# Patient Record
Sex: Female | Born: 1955 | Race: White | Hispanic: No | Marital: Married | Smoking: Former smoker
Health system: Southern US, Community
[De-identification: ages and names within clinical notes are randomized; demographics above are authoritative.]

## PROBLEM LIST (undated history)

## (undated) DIAGNOSIS — K219 Gastro-esophageal reflux disease without esophagitis: Secondary | ICD-10-CM

## (undated) DIAGNOSIS — T8859XA Other complications of anesthesia, initial encounter: Secondary | ICD-10-CM

## (undated) DIAGNOSIS — F419 Anxiety disorder, unspecified: Secondary | ICD-10-CM

## (undated) DIAGNOSIS — M1990 Unspecified osteoarthritis, unspecified site: Secondary | ICD-10-CM

## (undated) DIAGNOSIS — T4145XA Adverse effect of unspecified anesthetic, initial encounter: Secondary | ICD-10-CM

## (undated) DIAGNOSIS — N644 Mastodynia: Secondary | ICD-10-CM

## (undated) HISTORY — PX: BREAST SURGERY: SHX581

## (undated) HISTORY — PX: HERNIA REPAIR: SHX51

## (undated) HISTORY — PX: AUGMENTATION MAMMAPLASTY: SUR837

## (undated) HISTORY — PX: FOOT SURGERY: SHX648

---

## 1983-02-10 HISTORY — PX: TUBAL LIGATION: SHX77

## 1996-02-10 HISTORY — PX: PLACEMENT OF BREAST IMPLANTS: SHX6334

## 1997-11-09 ENCOUNTER — Other Ambulatory Visit: Admission: RE | Admit: 1997-11-09 | Discharge: 1997-11-09 | Payer: Self-pay | Admitting: *Deleted

## 1998-03-04 ENCOUNTER — Ambulatory Visit (HOSPITAL_COMMUNITY): Admission: RE | Admit: 1998-03-04 | Discharge: 1998-03-04 | Payer: Self-pay | Admitting: Obstetrics and Gynecology

## 1998-12-04 ENCOUNTER — Other Ambulatory Visit: Admission: RE | Admit: 1998-12-04 | Discharge: 1998-12-04 | Payer: Self-pay | Admitting: Obstetrics and Gynecology

## 1999-10-03 ENCOUNTER — Encounter: Payer: Self-pay | Admitting: Orthopedic Surgery

## 1999-10-03 ENCOUNTER — Encounter: Admission: RE | Admit: 1999-10-03 | Discharge: 1999-10-03 | Payer: Self-pay | Admitting: Orthopedic Surgery

## 1999-10-30 ENCOUNTER — Encounter: Payer: Self-pay | Admitting: Orthopedic Surgery

## 1999-10-30 ENCOUNTER — Ambulatory Visit (HOSPITAL_COMMUNITY): Admission: RE | Admit: 1999-10-30 | Discharge: 1999-10-30 | Payer: Self-pay | Admitting: Orthopedic Surgery

## 1999-11-13 ENCOUNTER — Encounter: Payer: Self-pay | Admitting: Orthopedic Surgery

## 1999-11-13 ENCOUNTER — Ambulatory Visit (HOSPITAL_COMMUNITY): Admission: RE | Admit: 1999-11-13 | Discharge: 1999-11-13 | Payer: Self-pay | Admitting: Orthopedic Surgery

## 1999-12-12 ENCOUNTER — Other Ambulatory Visit: Admission: RE | Admit: 1999-12-12 | Discharge: 1999-12-12 | Payer: Self-pay | Admitting: Obstetrics and Gynecology

## 2000-06-02 ENCOUNTER — Encounter: Payer: Self-pay | Admitting: General Surgery

## 2000-06-02 ENCOUNTER — Encounter: Admission: RE | Admit: 2000-06-02 | Discharge: 2000-06-02 | Payer: Self-pay | Admitting: General Surgery

## 2000-06-03 ENCOUNTER — Ambulatory Visit (HOSPITAL_BASED_OUTPATIENT_CLINIC_OR_DEPARTMENT_OTHER): Admission: RE | Admit: 2000-06-03 | Discharge: 2000-06-03 | Payer: Self-pay | Admitting: General Surgery

## 2000-06-03 ENCOUNTER — Encounter (INDEPENDENT_AMBULATORY_CARE_PROVIDER_SITE_OTHER): Payer: Self-pay | Admitting: *Deleted

## 2000-12-17 ENCOUNTER — Other Ambulatory Visit: Admission: RE | Admit: 2000-12-17 | Discharge: 2000-12-17 | Payer: Self-pay | Admitting: Obstetrics and Gynecology

## 2002-01-11 ENCOUNTER — Other Ambulatory Visit: Admission: RE | Admit: 2002-01-11 | Discharge: 2002-01-11 | Payer: Self-pay | Admitting: Obstetrics and Gynecology

## 2002-04-04 ENCOUNTER — Encounter: Admission: RE | Admit: 2002-04-04 | Discharge: 2002-04-04 | Payer: Self-pay | Admitting: General Surgery

## 2002-04-04 ENCOUNTER — Encounter: Payer: Self-pay | Admitting: General Surgery

## 2002-04-05 ENCOUNTER — Ambulatory Visit (HOSPITAL_BASED_OUTPATIENT_CLINIC_OR_DEPARTMENT_OTHER): Admission: RE | Admit: 2002-04-05 | Discharge: 2002-04-05 | Payer: Self-pay | Admitting: General Surgery

## 2004-04-28 ENCOUNTER — Other Ambulatory Visit: Admission: RE | Admit: 2004-04-28 | Discharge: 2004-04-28 | Payer: Self-pay | Admitting: Obstetrics and Gynecology

## 2006-01-22 ENCOUNTER — Emergency Department (HOSPITAL_COMMUNITY): Admission: EM | Admit: 2006-01-22 | Discharge: 2006-01-22 | Payer: Self-pay | Admitting: Emergency Medicine

## 2006-01-25 ENCOUNTER — Encounter: Admission: RE | Admit: 2006-01-25 | Discharge: 2006-01-25 | Payer: Self-pay | Admitting: Gastroenterology

## 2008-12-05 ENCOUNTER — Encounter (INDEPENDENT_AMBULATORY_CARE_PROVIDER_SITE_OTHER): Payer: Self-pay | Admitting: *Deleted

## 2009-02-15 ENCOUNTER — Encounter: Admission: RE | Admit: 2009-02-15 | Discharge: 2009-02-15 | Payer: Self-pay | Admitting: Surgery

## 2009-11-06 ENCOUNTER — Emergency Department (HOSPITAL_COMMUNITY): Admission: EM | Admit: 2009-11-06 | Discharge: 2009-11-06 | Payer: Self-pay | Admitting: Emergency Medicine

## 2010-06-27 NOTE — Op Note (Signed)
NAME:  [name], [name] NO.:  [id number]   MEDICAL RECORD NO.:  [id number]                   PATIENT TYPE:  AMB   LOCATION:  DSC                                  FACILITY:  [facility]   PHYSICIAN:  Jimmye Norman III, M.D.               DATE OF BIRTH:  11/03/1955   DATE OF PROCEDURE:  04/05/2002  DATE OF DISCHARGE:                                 OPERATIVE REPORT   PREOPERATIVE DIAGNOSIS:  Right inguinal hernia.   POSTOPERATIVE DIAGNOSIS:  Right inguinal hernia, direct type.   PROCEDURE:  McVay-Cooper right inguinal hernia repair without mesh.   SURGEON:  Jimmye Norman, M.D.   ANESTHESIA:  General endotracheal.   ESTIMATED BLOOD LOSS:  Less than 50 mL.   COMPLICATIONS:  None.   CONDITION:  Stable.   INDICATION FOR PROCEDURE:  The patient is a 55 year old female with a lump  in her right groin.  Found to have a hernia on exam at last clinic visit and  now comes in for repair.   DESCRIPTION OF PROCEDURE:  The patient was taken to the operating room and  placed on the table in supine position.  After an adequate endotracheal  anesthetic was administered, she was prepped and draped in the usual sterile  manner, exposing the right lower quadrant and inguinal area.   A #10 blade was used to make sort of a slanted incision at the level above  the right inguinal ligament.  It was taken down through the subcu using  electrocautery and down to the area of the external oblique fascia.  We were  able to dissect out the connective tissue on top of the internal oblique  fascia and then opened the external oblique through the superficial ring.  The ilioinguinal nerve could be seen coursing inferolaterally out through  the superficial ring, and it was preserved during the case.   We isolated the round ligament in the inguinal canal and were able to fill  the direct defect up near the internal ring.  We isolated Cooper's ligament  and subsequently did an  interrupted stitch repair of Cooper's ligament to  the conjoined tendon using interrupted 0 Ethibond sutures.  Approximately  eight sutures were used, and we made a transition zone at the level of the  femoral vein.  At that point we took the conjoined tendon to the reflected  portion of the inguinal ligament.  It was noted then that there was no  further defect that could be palpated.  The hernia defect appeared to be  well closed out, and therefore no attempt was made to place mesh.  We  irrigated with antibiotic solution, then closed the external oblique fascia  on top of the round ligament and also the ilioinguinal nerve using a running  3-0 Vicryl.  Care was taken not to entrap the nerve with the repair of the  fascia, and it was also noted that at  the level of the superficial ring,  which was recreated with the transition stitch, there was no entrapment of  the nerve.  We closed the external oblique fascia with running 3-0 Vicryl, then we  closed Scarpa's using interrupted 3-0 Vicryl sutures, then the skin was  closed using a running subcuticular stitch of 4-0 Prolene.  Marcaine 0.25%  was injected into the wound, a total of 18 mL were used.                                               Sandy Bradshaw, M.D.    JW/MEDQ  D:  04/05/2002  T:  04/05/2002  Job:  [id number]

## 2010-06-27 NOTE — Op Note (Signed)
[place]. [facility]  Patient:    Sandy Bradshaw, Sandy Bradshaw                  MRN: [id number] Proc. Date: 06/03/00 Adm. Date:  [id number] Attending:  Cherylynn Ridges                           Operative Report  PREOPERATIVE DIAGNOSIS:  Sebaceous cyst of left gluteal fold near the junction of the anus and the vagina posteriorly.  POSTOPERATIVE DIAGNOSIS:   Sebaceous cyst of left gluteal fold near the junction of the anus and the vagina posteriorly.  PROCEDURE:  Excision of sebaceous cyst.  SURGEON:  Jimmye Norman, M.D.  ASSISTANT:  None.  ANESTHESIA:  General endotracheal.  ESTIMATED BLOOD LOSS:  Less than 20 cc.  COMPLICATIONS:  None.  CONDITION:  Stable.  INDICATION FOR OPERATION:  The patient had an irritating lump on her left gluteal cyst difficult for her to sit, now comes in for excision.  FINDINGS:  No unusual findings on examination.  It did not appear to be infected.  We did not enter the cyst during the procedure.  DESCRIPTION OF PROCEDURE:   The patient was initially taken to the operating room in a supine position.  After an endotracheal anesthetic was administered, she was flipped into the Jackknife/prone position and her left paragluteal area prepped with Betadine.  An oval incision was made around the lump that was sort of superior and medial to the inferior gluteal fold on the left side.  This measured approximately 3.5 to 4 cm long and it was taken down into the subcutaneous tissue with a 15-blade and then subsequently excised using electrocautery.  We irrigated with saline, obtained adequate hemostasis with the electrocautery, then sutured the skin edges back together using interrupted simple stitches of 4-0 nylon.  A sterile dressing was applied, 0.25% Marcaine with epinephrine was injected into the incision.  All needle counts, sponge counts and instrument counts were correct.  A sterile dressing was applied. DD:  06/03/00 TD:   06/03/00 Job: [id number]

## 2010-06-27 NOTE — H&P (Signed)
. [facility]  Patient:    Sandy Bradshaw, Sandy Bradshaw                    MRN: [id number] Adm. Date:  06/03/00 Attending:  Jimmye Norman, M.D. CC:         Silverio Lay, M.D.   History and Physical  DATE OF BIRTH: February 14, 1965  CHIEF COMPLAINT: The patient is a 55 year old female with a 1.5 cm left gluteal perianal sebaceous cyst.  HISTORY OF PRESENT ILLNESS: The patient has had this in place for approximately two months and it began to flare on her about two months ago, but did not get infected.  She used warm compresses and it subsided in terms of discomfort.  However, it never did drain any pus.  Now it feels like a firm rock and it is very uncomfortable to sit on, and she wishes it to be excised. 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  PAST MEDICAL HISTORY: Significant only for the neuroma of the hand.  PAST SURGICAL HISTORY:  1. Tubal ligation.  2. Mortons neuroma of right foot.  3. Neuroma of right hand.  4. Implant on chest wall for chest wall defect.  5. Polyps.  6. Cyst removed.  CURRENT MEDICATIONS: Neurontin 300 mg q.h.s.  PHYSICAL EXAMINATION: Her left peroneal and gluteal area sort of anterior to the rectum has a 1.5 cm cyst, which is minimally tender and mobile, and is the point that the patient finds to be irritating.  IMPRESSION: Sebaceous cyst, which is not infected, of the perineum.  PLAN: Excise cyst with the patient in the prone position, likely using monitored anesthesia care.  If she is in the lithotomy position it can be done with a laryngeal airway. DD:  06/03/00 TD:  06/03/00 Job: [id number]

## 2011-06-08 ENCOUNTER — Other Ambulatory Visit: Payer: Self-pay | Admitting: Plastic Surgery

## 2011-06-08 DIAGNOSIS — N644 Mastodynia: Secondary | ICD-10-CM

## 2011-06-08 DIAGNOSIS — N63 Unspecified lump in unspecified breast: Secondary | ICD-10-CM

## 2011-06-09 ENCOUNTER — Other Ambulatory Visit: Payer: Self-pay | Admitting: Plastic Surgery

## 2011-06-09 ENCOUNTER — Ambulatory Visit
Admission: RE | Admit: 2011-06-09 | Discharge: 2011-06-09 | Disposition: A | Discharge status: Home / Self Care | Payer: BC Managed Care – PPO | Source: Ambulatory Visit | Attending: Plastic Surgery | Admitting: Plastic Surgery

## 2011-06-09 DIAGNOSIS — N644 Mastodynia: Secondary | ICD-10-CM

## 2011-06-09 DIAGNOSIS — N63 Unspecified lump in unspecified breast: Secondary | ICD-10-CM

## 2011-10-26 ENCOUNTER — Encounter: Payer: Self-pay | Admitting: Internal Medicine

## 2012-06-09 ENCOUNTER — Encounter: Payer: Self-pay | Admitting: Internal Medicine

## 2014-04-27 ENCOUNTER — Other Ambulatory Visit: Payer: Self-pay

## 2014-04-27 DIAGNOSIS — Z1231 Encounter for screening mammogram for malignant neoplasm of breast: Secondary | ICD-10-CM

## 2014-05-03 ENCOUNTER — Ambulatory Visit
Admission: RE | Admit: 2014-05-03 | Discharge: 2014-05-03 | Disposition: A | Discharge status: Home / Self Care | Payer: BC Managed Care – PPO | Source: Ambulatory Visit

## 2014-05-03 DIAGNOSIS — Z1231 Encounter for screening mammogram for malignant neoplasm of breast: Secondary | ICD-10-CM

## 2015-12-19 ENCOUNTER — Encounter (INDEPENDENT_AMBULATORY_CARE_PROVIDER_SITE_OTHER): Payer: Self-pay

## 2015-12-19 ENCOUNTER — Encounter: Payer: Self-pay | Admitting: Allergy & Immunology

## 2015-12-19 ENCOUNTER — Ambulatory Visit (INDEPENDENT_AMBULATORY_CARE_PROVIDER_SITE_OTHER): Payer: BC Managed Care – PPO | Admitting: Allergy & Immunology

## 2015-12-19 VITALS — BP 120/70 | HR 65 | Temp 98.5°F | Resp 16 | Ht 66.14 in | Wt 198.0 lb

## 2015-12-19 DIAGNOSIS — R21 Rash and other nonspecific skin eruption: Secondary | ICD-10-CM | POA: Diagnosis not present

## 2015-12-19 DIAGNOSIS — R682 Dry mouth, unspecified: Secondary | ICD-10-CM

## 2015-12-19 DIAGNOSIS — E507 Other ocular manifestations of vitamin A deficiency: Secondary | ICD-10-CM

## 2015-12-19 DIAGNOSIS — R1310 Dysphagia, unspecified: Secondary | ICD-10-CM

## 2015-12-19 DIAGNOSIS — K117 Disturbances of salivary secretion: Secondary | ICD-10-CM

## 2015-12-19 MED ORDER — TRIAMCINOLONE ACETONIDE 0.1 % EX OINT: 1.0000 "application " | TOPICAL_OINTMENT | Freq: Two times a day (BID) | CUTANEOUS | 2 refills | Status: DC

## 2015-12-19 NOTE — Progress Notes (Signed)
NEW PATIENT  Date of Service/Encounter:  12/20/15   Assessment:   Rash  Xerostomia  Dysphagia  Xerophthalmia    Plan/Recommendations:     1. Rash - with coexisting dry mouth, dry eyes, and difficulty swallowing - Unsure of the etiology of the rash, but I feel that a biopsy would be warranted and would be beneficial to diagnosis and management. - Review of her dermatology records after the visit shows that she has had multiple biopsies, although the lesions apparently were different than the rash she is experiencing at this time.  - Rash does not appear to be herpetic in nature, as the rash crosses the midline of the back and therefore does not conform to the dermatome itself.  - In addition, there has been no resolution with the use of multiple courses of valacyclovir, which should have resolved the rash. -The co-existing dysphagia, dry mouth, and dry eyes brings up the possibility of Sjogren's syndrome. -The rash is not classic for Sjogren's syndrome, but we will check for anti-SSA and anti-SSB to rule this out.  - At this point, I think it would be prudent to obtain a skin biopsy first and then send to Rheumatology for further workup. -Contact dermatitis is possible, albeit unlikely since she is already using appropriate allergen-free cleansing agents. -Contact dermatitis to cleaning agents would most likely be located over additional areas of her body. - We could consider patch testing in the future.  - I did prescribe a triamcinolone ointment to use twice daily as needed as a means of controlling the inflammation and the itching. - I also recommend high dose antihistamine (Allegra two tablets once daily) to help with the pruritus as well.  - We will get some lab work today: complete blood count, C-reactive protein (CRP), erythrocyte sedimentation rate (ESR), antineutrophil antibody (ANA), and anti-Sjogrens syndrome-A (SSA) + anti-Sjogrens syndrome-B  - I would recommend  getting a biopsy from Dermatology. - We will call in one week with the results.  2. Return in about 3 months (around 03/20/2016).    Subjective:   Sandy Bradshaw is a 60 y.o. female presenting today for evaluation of  Chief Complaint  Patient presents with  . Rash    patient stated that she has a rash on her back that has been their off and on for six years  .  Sandy Bradshaw has a history of the following: There are no active problems to display for this patient.   History obtained from: chart review and patient.  Sandy Bradshaw was referred by Sandy Crutch, MD.     Sandy Bradshaw is a 60 y.o. female presenting for evaluation of a rash. The rash started six years ago. She had shingles on her lower back that was preceded by the neuropathic pain. She went to the hospital ED where she was not even treated with pain medication. Days after that ED visit, the rash appeared. There was no swab performed but it appeared classic for shingles. She was treated with an antiviral for a couple of weeks with resolution of the rash. She received the shingles vaccine after that appointment. This did help the pain but the itching has continued. The rash has never full gone away.   Since that time, she has continued to have rash with the same distribution. It never clears but there are times when the itching is more intense than other times. The last time that she got it was a couple of years ago. Rash  has never showed up anywhere else. She as changed to dye free detergents and soaps. She is using [employer] soaps. She has tried switching her chair back. She does have some joint pain, but not necessarily related to that rash.   She has seen Dr. Arminda Resides and Dr. Donzetta Starch at [facility], last appointment was a couple of years ago. They never biopsied it but instead just treated her shingles. She has had a low grade fever. She has never been seen by a Rheuamtologist. She has been  treated for shingles on several occasions without resolution. She was treated by her PCP at some point, as well as the Dermatologist. The Dermatologist "did not know what was going on" per the patient. The Aveeno stops the itching. Per the patient, she is using no prescription medications. She has not taken any antihistamines to control the itching.   Sandy Bradshaw does have a fall and spring allergies but they have gotten better over time. She denies needing an inhaler. She has never needed any hospital stays for asthma. She has no problems with other infections, estimating around <1 course of antibiotics per year. She does have dry eyes and dry mouth. 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 She was placed on artificial tears as a treatment. She denies any fevers or joint pain.   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  She does a history of IBS, treated with gluten free diet. She has a history of GERD but is not on a PPI.    Otherwise, there is no history of other atopic diseases, including asthma, drug allergies, food allergies, stinging insect allergies, or urticaria. There is no significant infectious history. Vaccinations are up to date.   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, hyperkeratosis, and hyperpigmentation, changes correlate with verruca vulgaris February  2010 - shave biopsy of left cheek lesion performed, came back positive for "abnormal maturation of keratinocyte atypica of the epidermis with no infiltration of tumor, confirmating the diagnopsis of actinic keratosis  June 2014 - diagnosed with seborrheic keratosis, actinic keratosis, and rosacea (treated with topical metronidazole as well as Efudex 5% topical cream) December 2015 - diagnosed with actinic keratosis (treated with Efudex 5% topical cream)     Past Medical History: There are no active problems to display for this patient.   Medication List:    Medication List       Accurate as of 12/19/15 11:59 PM. Always use your most recent med list.          triamcinolone ointment 0.1 % Commonly known as:  KENALOG Apply 1 application topically 2 (two) times daily.       Birth History: non-contributory.   Developmental History: Maeson has met all milestones on time. She has required no speech therapy, occupational therapy, or physical therapy.   Past Surgical History: Past Surgical History:  Procedure Laterality  Date  . FOOT SURGERY Right   . HERNIA REPAIR Right   . TUBAL LIGATION  1985     Family History: Family History  Problem Relation Age of Onset  . Allergic rhinitis Neg Hx   . Angioedema Neg Hx   . Asthma Neg Hx   . Atopy Neg Hx   . Eczema Neg Hx   . Immunodeficiency Neg Hx   . Urticaria Neg Hx      Social History: Ski lives at home that is 61 years old. There is laminate throughout the home. She does have one bird in her home. She does have dust mite covers on her pillows and her bed. There is no tobacco exposure. She currently works in an office setting without chemical exposure.    Review of Systems: a 14-point review of systems is pertinent for what is mentioned in HPI.  Otherwise, all other systems were negative. Constitutional: negative other than that listed in the HPI Eyes: negative other than that listed in the HPI Ears, nose, mouth,  throat, and face: negative other than that listed in the HPI Respiratory: negative other than that listed in the HPI Cardiovascular: negative other than that listed in the HPI Gastrointestinal: negative other than that listed in the HPI Genitourinary: negative other than that listed in the HPI Integument: negative other than that listed in the HPI Hematologic: negative other than that listed in the HPI Musculoskeletal: negative other than that listed in the HPI Neurological: negative other than that listed in the HPI Allergy/Immunologic: negative other than that listed in the HPI    Objective:   Blood pressure 120/70, pulse 65, temperature 98.5 F (36.9 C), temperature source Oral, resp. rate 16, height 5' 6.14" (1.68 m), weight 198 lb (89.8 kg). Body mass index is 31.82 kg/m.   Physical Exam:  General: Alert, interactive, in no acute distress. Cooperative with the exam. Pleasant but to the point in her responses. HEENT: TMs pearly gray, turbinates edematous without discharge, post-pharynx mildly erythematous. Neck: Supple without thyromegaly. Adenopathy: no enlarged lymph nodes appreciated in the anterior cervical, occipital, axillary, epitrochlear, inguinal, or popliteal regions Lungs: Clear to auscultation without wheezing, rhonchi or rales. No increased work of breathing. CV: Normal S1/S2, no murmurs. Capillary refill <2 seconds.  Abdomen: Nondistended, nontender. No guarding or rebound tenderness. Bowel sounds faint and present in all fields  Skin: Distinct clusters of erythematous papules on her back in regions that do cross the midline of the back. There is no vesicular component to the rash currently (but there has been in the past according to the patient). There are isolated seborrhea keratosis lesions on the back as well. See pictures below.  Extremities:  No clubbing, cyanosis or edema. Neuro:   Grossly intact.  Pictures of the rash:    Diagnostic studies: None       Salvatore Marvel, MD [place] [facility] [place]

## 2015-12-19 NOTE — Patient Instructions (Addendum)
1. Rash - with coexisting dry mouth, dry eyes, and difficulty swallowing - Symptoms are concerning for Sjogren's syndrome, which is typically managed by a Rheumatologist. - We will get some lab work today: complete blood count, C-reactive protein (CRP), erythrocyte sedimentation rate (ESR), antineutrophil antibody (ANA), and anti-Sjogrens syndrome-A (SSA) + anti-Sjogrens syndrome-B  - I would recommend getting a biopsy from Dermatology. - We will send in triamcinolone ointment 0.1% applied twice daily as needed to help with the inflammation. - Start Allegra 357m (2 tablets) every evening to help with the itching. - We will call in one week with the results.  2. Return in about 3 months (around 03/20/2016).  Please inform [place] any Emergency Department visits, hospitalizations, or changes in symptoms. Call [place] going to the ED for breathing or allergy symptoms since we might be able to fit you in for a sick visit. Feel free to contact u[place]anytime with any questions, problems, or concerns.  It was a pleasure to meet you today!   Websites that have reliable patient information: 1. American Academy of Asthma, Allergy, and Immunology: www.aaaai.org 2. Food Allergy Research and Education (FARE): foodallergy.org 3. Mothers of Asthmatics: http://www.asthmacommunitynetwork.org 4. American College of Allergy, Asthma, and Immunology: www.acaai.org

## 2015-12-21 LAB — CBC WITH DIFFERENTIAL/PLATELET
BASOS PCT: 1 %
Basophils Absolute: 42 cells/uL (ref 0–200)
Eosinophils Absolute: 252 cells/uL (ref 15–500)
Eosinophils Relative: 6 %
HEMATOCRIT: 43.9 % (ref 35.0–45.0)
HEMOGLOBIN: 15.3 g/dL (ref 11.7–15.5)
LYMPHS ABS: 1932 {cells}/uL (ref 850–3900)
Lymphocytes Relative: 46 %
MCH: 30.7 pg (ref 27.0–33.0)
MCHC: 34.9 g/dL (ref 32.0–36.0)
MCV: 88.2 fL (ref 80.0–100.0)
MONO ABS: 252 {cells}/uL (ref 200–950)
MPV: 9.4 fL (ref 7.5–12.5)
Monocytes Relative: 6 %
NEUTROS ABS: 1722 {cells}/uL (ref 1500–7800)
Neutrophils Relative %: 41 %
Platelets: 244 10*3/uL (ref 140–400)
RBC: 4.98 MIL/uL (ref 3.80–5.10)
RDW: 13.4 % (ref 11.0–15.0)
WBC: 4.2 10*3/uL (ref 3.8–10.8)

## 2015-12-23 LAB — C-REACTIVE PROTEIN: CRP: 5.8 mg/L (ref <8.0)

## 2015-12-23 LAB — SJOGRENS SYNDROME-B EXTRACTABLE NUCLEAR ANTIBODY: SSB (La) (ENA) Antibody, IgG: <1

## 2015-12-23 LAB — ANA: ANA: NEGATIVE

## 2015-12-23 LAB — SJOGRENS SYNDROME-A EXTRACTABLE NUCLEAR ANTIBODY: SSA (RO) (ENA) ANTIBODY, IGG: NEGATIVE

## 2015-12-24 LAB — SEDIMENTATION RATE

## 2015-12-31 ENCOUNTER — Encounter: Payer: Self-pay | Admitting: Allergy & Immunology

## 2017-04-16 ENCOUNTER — Ambulatory Visit: Payer: Self-pay | Admitting: Surgery

## 2017-04-16 NOTE — H&P (Addendum)
Sandy Bradshaw Location: [place] [facility] Patient #: [id number] DOB: 22-Apr-1955 Undefined / Language: [place] / Race: White Female   History of Present Illness Sandy Bradshaw B. Daphine Deutscher MD; 03/31/2017 2:26 PM) The patient is a 62 year old female who presents with a complaint of left breast painn. 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 The left implant has been deflated and it is hurting her and she wants it removed. She was referred by Dr. Estanislado Pandy and verbally there is a contracture around the implant. She did not wish to return to the plastic surgeon.  Informed consent was obtained regarding removal of the left breast implant   Past Surgical History Doristine Devoid, CMA; 03/31/2017 2:04 PM) Breast Augmentation  Bilateral. Foot Surgery  Right. Laparoscopic Inguinal Hernia Surgery  Right. Oral Surgery   Diagnostic Studies History Doristine Devoid, CMA; 03/31/2017 2:04 PM) Colonoscopy  >10 years ago Mammogram  within last year Pap Smear  1-5 years ago  Allergies Doristine Devoid, CMA; 03/31/2017 2:07 PM) No Known Drug Allergies [03/31/2017]:  Medication History Doristine Devoid, CMA; 03/31/2017 2:07 PM) Ibuprofen (200MG  Tablet, Oral) Active. Medications Reconciled  Social History Doristine Devoid, CMA; 03/31/2017 2:04 PM) Alcohol use  Occasional alcohol use. Caffeine use  Carbonated beverages, Tea. No drug use  Tobacco use  Former smoker.  Family History Doristine Devoid, [place]; 03/31/2017 2:04 PM) Anesthetic complications  Sister. Arthritis  Father, Mother, Sister. Depression  Sister. Diabetes Mellitus  Father. Heart Disease  Father, Mother. Hypertension  Father. Migraine Headache  Sister.  Pregnancy / Birth History Doristine Devoid, CMA; 03/31/2017 2:04 PM) Age at menarche  11 years. Age of menopause  17-55 Gravida  3 Maternal age  58-20 Para   3  Other Problems Doristine Devoid, CMA; 03/31/2017 2:04 PM) Anxiety Disorder  Back Pain  Depression  Diverticulosis  Gastroesophageal Reflux Disease     Review of Systems Doristine Devoid CMA; 03/31/2017 2:04 PM) General Not Present- Appetite Loss, Chills, Fatigue, Fever, Night Sweats, Weight Gain and Weight Loss. Skin Not Present- Change in Wart/Mole, Dryness, Hives, Jaundice, New Lesions, Non-Healing Wounds, Rash and Ulcer. HEENT Present- Seasonal Allergies and Wears glasses/contact lenses. Not Present- Earache, Hearing Loss, Hoarseness, Nose Bleed, Oral Ulcers, Ringing in the Ears, Sinus Pain, Sore Throat, Visual Disturbances and Yellow Eyes. Respiratory Present- Snoring. Not Present- Bloody sputum, Chronic Cough, Difficulty Breathing and Wheezing. Breast Present- Breast Pain. Not Present- Breast Mass, Nipple Discharge and Skin Changes. Cardiovascular Not Present- Chest Pain, Difficulty Breathing Lying Down, Leg Cramps, Palpitations, Rapid Heart Rate, Shortness of Breath and Swelling of Extremities. Gastrointestinal Present- Indigestion. Not Present- Abdominal Pain, Bloating, Bloody Stool, Change in Bowel Habits, Chronic diarrhea, Constipation, Difficulty Swallowing, Excessive gas, Gets full quickly at meals, Hemorrhoids, Nausea, Rectal Pain and Vomiting. Female Genitourinary Not Present- Frequency, Nocturia, Painful Urination, Pelvic Pain and Urgency. Musculoskeletal Present- Back Pain and Joint Pain. Not Present- Joint Stiffness, Muscle Pain, Muscle Weakness and Swelling of Extremities. Neurological Present- Numbness and Tingling. Not Present- Decreased Memory, Fainting, Headaches, Seizures, Tremor, Trouble walking and Weakness. Psychiatric Present- Anxiety. Not Present- Bipolar, Change in Sleep Pattern, Depression, Fearful and Frequent crying. Endocrine Present- Hot flashes. Not Present- Cold Intolerance, Excessive Hunger, Hair Changes, Heat Intolerance and New Diabetes. Hematology Not  Present- Blood Thinners, Easy Bruising, Excessive bleeding, Gland problems, HIV and Persistent Infections.  Vitals (Chemira Jones CMA; 03/31/2017 2:07 PM) 03/31/2017 2:06 PM Weight:  198.8 lb Height: 67in Body Surface Area: 2.02 m Body Mass Index: 31.14 kg/m  Pulse: 86 (Regular)  BP: 140/70 (Sitting, Left Arm, Standard)       Physical Exam (Seymour Pavlak B. Daphine DeutscherMartin MD; 03/31/2017 2:29 PM)  HEENT  Neck Chest:  Right breast implant and right chest hypoplastic Breast Left:   cirumareolar incision and not a smooth implant on the left with pain;  Irregular and appears deflated  Right arm and hand are smaller than the left  Assessment & Plan Sandy Bradshaw(Sandy Bradshaw B. 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 Patient wants this to be removed.  Plan:  Remove left breast implant.    Matt B. Daphine DeutscherMartin, MD, FACS

## 2017-04-19 ENCOUNTER — Other Ambulatory Visit: Payer: Self-pay

## 2017-04-19 ENCOUNTER — Encounter (HOSPITAL_BASED_OUTPATIENT_CLINIC_OR_DEPARTMENT_OTHER): Payer: Self-pay | Admitting: *Deleted

## 2017-04-21 NOTE — Progress Notes (Signed)
Pre surgery Ensure given with instructions to finish by 0830 DOS. Also CHG solution given for shower night before and morning of surgery. Verbalized understanding

## 2017-04-23 ENCOUNTER — Ambulatory Visit (HOSPITAL_BASED_OUTPATIENT_CLINIC_OR_DEPARTMENT_OTHER): Payer: BC Managed Care – PPO | Admitting: Anesthesiology

## 2017-04-23 ENCOUNTER — Ambulatory Visit (HOSPITAL_BASED_OUTPATIENT_CLINIC_OR_DEPARTMENT_OTHER)
Admission: RE | Admit: 2017-04-23 | Discharge: 2017-04-23 | Disposition: A | Discharge status: Home / Self Care | Payer: BC Managed Care – PPO | Source: Ambulatory Visit | Attending: Surgery | Admitting: Surgery

## 2017-04-23 ENCOUNTER — Encounter (HOSPITAL_BASED_OUTPATIENT_CLINIC_OR_DEPARTMENT_OTHER)
Admission: RE | Disposition: A | Discharge status: Home / Self Care | Payer: Self-pay | Source: Ambulatory Visit | Attending: Surgery

## 2017-04-23 ENCOUNTER — Other Ambulatory Visit: Payer: Self-pay

## 2017-04-23 ENCOUNTER — Encounter (HOSPITAL_BASED_OUTPATIENT_CLINIC_OR_DEPARTMENT_OTHER): Payer: Self-pay | Admitting: *Deleted

## 2017-04-23 DIAGNOSIS — Q798 Other congenital malformations of musculoskeletal system: Secondary | ICD-10-CM

## 2017-04-23 DIAGNOSIS — F419 Anxiety disorder, unspecified: Secondary | ICD-10-CM | POA: Insufficient documentation

## 2017-04-23 DIAGNOSIS — Y9289 Other specified places as the place of occurrence of the external cause: Secondary | ICD-10-CM | POA: Diagnosis not present

## 2017-04-23 DIAGNOSIS — Z79899 Other long term (current) drug therapy: Secondary | ICD-10-CM | POA: Insufficient documentation

## 2017-04-23 DIAGNOSIS — E669 Obesity, unspecified: Secondary | ICD-10-CM | POA: Insufficient documentation

## 2017-04-23 DIAGNOSIS — N644 Mastodynia: Secondary | ICD-10-CM | POA: Insufficient documentation

## 2017-04-23 DIAGNOSIS — Z87891 Personal history of nicotine dependence: Secondary | ICD-10-CM | POA: Diagnosis not present

## 2017-04-23 DIAGNOSIS — Z6831 Body mass index (BMI) 31.0-31.9, adult: Secondary | ICD-10-CM | POA: Diagnosis not present

## 2017-04-23 DIAGNOSIS — Y828 Other medical devices associated with adverse incidents: Secondary | ICD-10-CM | POA: Diagnosis not present

## 2017-04-23 DIAGNOSIS — K219 Gastro-esophageal reflux disease without esophagitis: Secondary | ICD-10-CM | POA: Diagnosis not present

## 2017-04-23 DIAGNOSIS — T85848A Pain due to other internal prosthetic devices, implants and grafts, initial encounter: Secondary | ICD-10-CM | POA: Insufficient documentation

## 2017-04-23 DIAGNOSIS — Z9886 Personal history of breast implant removal: Secondary | ICD-10-CM

## 2017-04-23 HISTORY — DX: Adverse effect of unspecified anesthetic, initial encounter: T41.45XA

## 2017-04-23 HISTORY — DX: Mastodynia: N64.4

## 2017-04-23 HISTORY — DX: Gastro-esophageal reflux disease without esophagitis: K21.9

## 2017-04-23 HISTORY — DX: Anxiety disorder, unspecified: F41.9

## 2017-04-23 HISTORY — DX: Other complications of anesthesia, initial encounter: T88.59XA

## 2017-04-23 HISTORY — DX: Unspecified osteoarthritis, unspecified site: M19.90

## 2017-04-23 HISTORY — PX: BREAST IMPLANT REMOVAL: SHX5361

## 2017-04-23 SURGERY — REMOVAL, IMPLANT, BREAST
Anesthesia: General | Site: Breast | Laterality: Left

## 2017-04-23 MED ORDER — LIDOCAINE HCL (PF) 1 % IJ SOLN
INTRAMUSCULAR | Status: AC
  Filled 2017-04-23: qty 30

## 2017-04-23 MED ORDER — ONDANSETRON HCL 4 MG/2ML IJ SOLN
INTRAMUSCULAR | Status: AC
  Filled 2017-04-23: qty 12

## 2017-04-23 MED ORDER — FENTANYL CITRATE (PF) 100 MCG/2ML IJ SOLN
INTRAMUSCULAR | Status: AC
  Filled 2017-04-23: qty 2

## 2017-04-23 MED ORDER — GABAPENTIN 300 MG PO CAPS
300.0000 mg | ORAL_CAPSULE | ORAL | Status: AC
  Administered 2017-04-23: 300 mg via ORAL

## 2017-04-23 MED ORDER — PROPOFOL 10 MG/ML IV BOLUS
INTRAVENOUS | Status: AC
  Filled 2017-04-23: qty 20

## 2017-04-23 MED ORDER — OXYCODONE HCL 5 MG PO TABS: 5.0000 mg | ORAL_TABLET | ORAL | Status: DC | PRN

## 2017-04-23 MED ORDER — SODIUM CHLORIDE 0.9% FLUSH: 3.0000 mL | INTRAVENOUS | Status: DC | PRN

## 2017-04-23 MED ORDER — 0.9 % SODIUM CHLORIDE (POUR BTL) OPTIME
TOPICAL | Status: DC | PRN
  Administered 2017-04-23: 300 mL

## 2017-04-23 MED ORDER — ONDANSETRON HCL 4 MG/2ML IJ SOLN: 4.0000 mg | Freq: Once | INTRAMUSCULAR | Status: DC | PRN

## 2017-04-23 MED ORDER — ACETAMINOPHEN 500 MG PO TABS
ORAL_TABLET | ORAL | Status: AC
  Filled 2017-04-23: qty 2

## 2017-04-23 MED ORDER — LIDOCAINE HCL (CARDIAC) 20 MG/ML IV SOLN
INTRAVENOUS | Status: DC | PRN
  Administered 2017-04-23: 60 mg via INTRAVENOUS

## 2017-04-23 MED ORDER — PROPOFOL 500 MG/50ML IV EMUL
INTRAVENOUS | Status: AC
  Filled 2017-04-23: qty 150

## 2017-04-23 MED ORDER — MIDAZOLAM HCL 2 MG/2ML IJ SOLN
INTRAMUSCULAR | Status: AC
  Filled 2017-04-23: qty 2

## 2017-04-23 MED ORDER — ACETAMINOPHEN 500 MG PO TABS
1000.0000 mg | ORAL_TABLET | ORAL | Status: AC
  Administered 2017-04-23: 1000 mg via ORAL

## 2017-04-23 MED ORDER — CELECOXIB 200 MG PO CAPS
200.0000 mg | ORAL_CAPSULE | ORAL | Status: AC
  Administered 2017-04-23: 200 mg via ORAL

## 2017-04-23 MED ORDER — MIDAZOLAM HCL 2 MG/2ML IJ SOLN
1.0000 mg | INTRAMUSCULAR | Status: DC | PRN
  Administered 2017-04-23: 2 mg via INTRAVENOUS

## 2017-04-23 MED ORDER — LIDOCAINE HCL (CARDIAC) 20 MG/ML IV SOLN
INTRAVENOUS | Status: AC
  Filled 2017-04-23: qty 15

## 2017-04-23 MED ORDER — HEPARIN SODIUM (PORCINE) 5000 UNIT/ML IJ SOLN
INTRAMUSCULAR | Status: AC
Start: 2017-04-23 — End: 2017-04-23
  Filled 2017-04-23: qty 1

## 2017-04-23 MED ORDER — PROPOFOL 10 MG/ML IV BOLUS
INTRAVENOUS | Status: DC | PRN
  Administered 2017-04-23: 150 mg via INTRAVENOUS

## 2017-04-23 MED ORDER — DEXAMETHASONE SODIUM PHOSPHATE 10 MG/ML IJ SOLN
INTRAMUSCULAR | Status: AC
  Filled 2017-04-23: qty 3

## 2017-04-23 MED ORDER — CHLORHEXIDINE GLUCONATE CLOTH 2 % EX PADS: 6.0000 | MEDICATED_PAD | Freq: Once | CUTANEOUS | Status: DC

## 2017-04-23 MED ORDER — CEFAZOLIN SODIUM-DEXTROSE 2-4 GM/100ML-% IV SOLN
2.0000 g | INTRAVENOUS | Status: AC
  Administered 2017-04-23: 2 g via INTRAVENOUS

## 2017-04-23 MED ORDER — PHENYLEPHRINE 40 MCG/ML (10ML) SYRINGE FOR IV PUSH (FOR BLOOD PRESSURE SUPPORT)
PREFILLED_SYRINGE | INTRAVENOUS | Status: AC
  Filled 2017-04-23: qty 10

## 2017-04-23 MED ORDER — SCOPOLAMINE 1 MG/3DAYS TD PT72: 1.0000 | MEDICATED_PATCH | Freq: Once | TRANSDERMAL | Status: DC | PRN

## 2017-04-23 MED ORDER — SODIUM CHLORIDE 0.9 % IJ SOLN
INTRAMUSCULAR | Status: AC
  Filled 2017-04-23: qty 20

## 2017-04-23 MED ORDER — SODIUM CHLORIDE 0.9 % IV SOLN: 250.0000 mL | INTRAVENOUS | Status: DC | PRN

## 2017-04-23 MED ORDER — FENTANYL CITRATE (PF) 100 MCG/2ML IJ SOLN
50.0000 ug | INTRAMUSCULAR | Status: AC | PRN
  Administered 2017-04-23 (×3): 50 ug via INTRAVENOUS

## 2017-04-23 MED ORDER — BUPIVACAINE LIPOSOME 1.3 % IJ SUSP
INTRAMUSCULAR | Status: AC
  Filled 2017-04-23: qty 20

## 2017-04-23 MED ORDER — FENTANYL CITRATE (PF) 100 MCG/2ML IJ SOLN: 25.0000 ug | INTRAMUSCULAR | Status: DC | PRN

## 2017-04-23 MED ORDER — ACETAMINOPHEN 325 MG PO TABS: 650.0000 mg | ORAL_TABLET | ORAL | Status: DC | PRN

## 2017-04-23 MED ORDER — CEFAZOLIN SODIUM-DEXTROSE 2-4 GM/100ML-% IV SOLN
INTRAVENOUS | Status: AC
Start: 2017-04-23 — End: 2017-04-23
  Filled 2017-04-23: qty 100

## 2017-04-23 MED ORDER — ACETAMINOPHEN 650 MG RE SUPP: 650.0000 mg | RECTAL | Status: DC | PRN

## 2017-04-23 MED ORDER — ONDANSETRON HCL 4 MG/2ML IJ SOLN
INTRAMUSCULAR | Status: DC | PRN
  Administered 2017-04-23: 4 mg via INTRAVENOUS

## 2017-04-23 MED ORDER — BUPIVACAINE HCL (PF) 0.5 % IJ SOLN
INTRAMUSCULAR | Status: AC
  Filled 2017-04-23: qty 30

## 2017-04-23 MED ORDER — SODIUM CHLORIDE 0.9% FLUSH: 3.0000 mL | Freq: Two times a day (BID) | INTRAVENOUS | Status: DC

## 2017-04-23 MED ORDER — HEPARIN SODIUM (PORCINE) 5000 UNIT/ML IJ SOLN
5000.0000 [IU] | Freq: Once | INTRAMUSCULAR | Status: AC
  Administered 2017-04-23: 5000 [IU] via SUBCUTANEOUS

## 2017-04-23 MED ORDER — OXYCODONE HCL 5 MG PO TABS: 5.0000 mg | ORAL_TABLET | Freq: Four times a day (QID) | ORAL | 0 refills | Status: AC | PRN

## 2017-04-23 MED ORDER — BUPIVACAINE LIPOSOME 1.3 % IJ SUSP
INTRAMUSCULAR | Status: DC | PRN
  Administered 2017-04-23: 20 mL

## 2017-04-23 MED ORDER — DEXAMETHASONE SODIUM PHOSPHATE 4 MG/ML IJ SOLN
INTRAMUSCULAR | Status: DC | PRN
  Administered 2017-04-23: 10 mg via INTRAVENOUS

## 2017-04-23 MED ORDER — CELECOXIB 200 MG PO CAPS
ORAL_CAPSULE | ORAL | Status: AC
  Filled 2017-04-23: qty 1

## 2017-04-23 MED ORDER — LACTATED RINGERS IV SOLN
INTRAVENOUS | Status: DC
  Administered 2017-04-23 (×2): via INTRAVENOUS

## 2017-04-23 MED ORDER — EPHEDRINE 5 MG/ML INJ
INTRAVENOUS | Status: AC
  Filled 2017-04-23: qty 10

## 2017-04-23 MED ORDER — GABAPENTIN 300 MG PO CAPS
ORAL_CAPSULE | ORAL | Status: AC
  Filled 2017-04-23: qty 1

## 2017-04-23 SURGICAL SUPPLY — 45 items
BANDAGE ACE 6X5 VEL STRL LF (GAUZE/BANDAGES/DRESSINGS) IMPLANT
BENZOIN TINCTURE PRP APPL 2/3 (GAUZE/BANDAGES/DRESSINGS) IMPLANT
BINDER BREAST XLRG (GAUZE/BANDAGES/DRESSINGS) ×2 IMPLANT
BLADE SURG 15 STRL LF DISP TIS (BLADE) ×1 IMPLANT
BLADE SURG 15 STRL SS (BLADE) ×1
CANISTER SUCT 1200ML W/VALVE (MISCELLANEOUS) ×2 IMPLANT
COVER BACK TABLE 60X90IN (DRAPES) ×2 IMPLANT
COVER MAYO STAND STRL (DRAPES) ×2 IMPLANT
DECANTER SPIKE VIAL GLASS SM (MISCELLANEOUS) IMPLANT
DERMABOND ADVANCED (GAUZE/BANDAGES/DRESSINGS) ×1
DERMABOND ADVANCED .7 DNX12 (GAUZE/BANDAGES/DRESSINGS) ×1 IMPLANT
DRAPE LAPAROSCOPIC ABDOMINAL (DRAPES) ×2 IMPLANT
ELECT COATED BLADE 2.86 ST (ELECTRODE) ×2 IMPLANT
ELECT REM PT RETURN 9FT ADLT (ELECTROSURGICAL) ×2
ELECTRODE REM PT RTRN 9FT ADLT (ELECTROSURGICAL) ×1 IMPLANT
GAUZE SPONGE 4X4 12PLY STRL (GAUZE/BANDAGES/DRESSINGS) ×2 IMPLANT
GLOVE BIO SURGEON STRL SZ8 (GLOVE) ×2 IMPLANT
GLOVE BIOGEL PI IND STRL 7.0 (GLOVE) ×3 IMPLANT
GLOVE BIOGEL PI INDICATOR 7.0 (GLOVE) ×3
GLOVE ECLIPSE 6.5 STRL STRAW (GLOVE) ×2 IMPLANT
GLOVE SURG SS PI 6.5 STRL IVOR (GLOVE) ×2 IMPLANT
GOWN STRL REUS W/ TWL LRG LVL3 (GOWN DISPOSABLE) ×2 IMPLANT
GOWN STRL REUS W/ TWL XL LVL3 (GOWN DISPOSABLE) ×1 IMPLANT
GOWN STRL REUS W/TWL LRG LVL3 (GOWN DISPOSABLE) ×2
GOWN STRL REUS W/TWL XL LVL3 (GOWN DISPOSABLE) ×1
ILLUMINATOR WAVEGUIDE N/F (MISCELLANEOUS) IMPLANT
LIGHT WAVEGUIDE WIDE FLAT (MISCELLANEOUS) IMPLANT
NEEDLE HYPO 25X1 1.5 SAFETY (NEEDLE) ×2 IMPLANT
NS IRRIG 1000ML POUR BTL (IV SOLUTION) ×2 IMPLANT
PACK BASIN DAY SURGERY FS (CUSTOM PROCEDURE TRAY) ×2 IMPLANT
PENCIL BUTTON HOLSTER BLD 10FT (ELECTRODE) ×2 IMPLANT
SLEEVE SCD COMPRESS KNEE MED (MISCELLANEOUS) ×2 IMPLANT
SPONGE LAP 18X18 RF (DISPOSABLE) ×2 IMPLANT
STRIP CLOSURE SKIN 1/2X4 (GAUZE/BANDAGES/DRESSINGS) IMPLANT
SUT MNCRL AB 4-0 PS2 18 (SUTURE) ×2 IMPLANT
SUT SILK 2 0 SH (SUTURE) IMPLANT
SUT VIC AB 4-0 SH 18 (SUTURE) ×2 IMPLANT
SUT VICRYL 3-0 CR8 SH (SUTURE) IMPLANT
SUT VICRYL 4-0 PS2 18IN ABS (SUTURE) IMPLANT
SYR BULB 3OZ (MISCELLANEOUS) ×2 IMPLANT
SYR CONTROL 10ML LL (SYRINGE) ×2 IMPLANT
TOWEL OR 17X24 6PK STRL BLUE (TOWEL DISPOSABLE) ×2 IMPLANT
TRAY DSU PREP LF (CUSTOM PROCEDURE TRAY) ×2 IMPLANT
TUBE CONNECTING 20X1/4 (TUBING) ×2 IMPLANT
YANKAUER SUCT BULB TIP NO VENT (SUCTIONS) ×2 IMPLANT

## 2017-04-23 NOTE — Anesthesia Preprocedure Evaluation (Addendum)
Anesthesia Evaluation  Patient identified by MRN, date of birth, ID band Patient awake    Reviewed: Allergy & Precautions, NPO status , Patient's Chart, lab work & pertinent test results  History of Anesthesia Complications (+) history of anesthetic complications ("Wakes up shivering")  Airway Mallampati: II  TM Distance: <3 FB Neck ROM: Full    Dental  (+) Teeth Intact, Dental Advisory Given,    Pulmonary former smoker,    Pulmonary exam normal breath sounds clear to auscultation       Cardiovascular Exercise Tolerance: Good negative cardio ROS Normal cardiovascular exam Rhythm:Regular Rate:Normal     Neuro/Psych PSYCHIATRIC DISORDERS Anxiety negative neurological ROS     GI/Hepatic Neg liver ROS, GERD  ,  Endo/Other  Obesity   Renal/GU negative Renal ROS     Musculoskeletal  (+) Arthritis , Osteoarthritis,    Abdominal   Peds  Hematology negative hematology ROS (+)   Anesthesia Other Findings Day of surgery medications reviewed with the patient.  Left breast mastodynia  Reproductive/Obstetrics                            Anesthesia Physical Anesthesia Plan  ASA: II  Anesthesia Plan: General   Post-op Pain Management:    Induction: Intravenous  PONV Risk Score and Plan: 3 and Dexamethasone, Ondansetron and Midazolam  Airway Management Planned: LMA  Additional Equipment:   Intra-op Plan:   Post-operative Plan: Extubation in OR  Informed Consent: I have reviewed the patients History and Physical, chart, labs and discussed the procedure including the risks, benefits and alternatives for the proposed anesthesia with the patient or authorized representative who has indicated his/her understanding and acceptance.   Dental advisory given  Plan Discussed with: CRNA  Anesthesia Plan Comments:         Anesthesia Quick Evaluation

## 2017-04-23 NOTE — Discharge Instructions (Addendum)
Information for Discharge Teaching:  EXPAREL (bupivacaine liposome injectable suspension)   Your surgeon gave you EXPAREL(bupivacaine) in your surgical incision to help control your pain after surgery.   EXPAREL is a local anesthetic that provides pain relief by numbing the tissue around the surgical site.  EXPAREL is designed to release pain medication over time and can control pain for up to 72 hours.  Depending on how you respond to EXPAREL, you may require less pain medication during your recovery.  Possible side effects:  Temporary loss of sensation or ability to move in the area where bupivacaine was injected.  Nausea, vomiting, constipation  Rarely, numbness and tingling in your mouth or lips, lightheadedness, or anxiety may occur.  Call your doctor right away if you think you may be experiencing any of these sensations, or if you have other questions regarding possible side effects.  Follow all other discharge instructions given to you by your surgeon or nurse. Eat a healthy diet and drink plenty of water or other fluids.  If you return to the hospital for any reason within 96 hours following the administration of EXPAREL, please inform your health care providers.  Post Anesthesia Home Care Instructions  Activity: Get plenty of rest for the remainder of the day. A responsible individual must stay with you for 24 hours following the procedure.  For the next 24 hours, DO NOT: -Drive a car -Advertising copywriterperate machinery -Drink alcoholic beverages -Take any medication unless instructed by your physician -Make any legal decisions or sign important papers.  Meals: Start with liquid foods such as gelatin or soup. Progress to regular foods as tolerated. Avoid greasy, spicy, heavy foods. If nausea and/or vomiting occur, drink only clear liquids until the nausea and/or vomiting subsides. Call your physician if vomiting continues.  Special Instructions/Symptoms: Your throat may feel dry or  sore from the anesthesia or the breathing tube placed in your throat during surgery. If this causes discomfort, gargle with warm salt water. The discomfort should disappear within 24 hours.  If you had a scopolamine patch placed behind your ear for the management of post- operative nausea and/or vomiting:  1. The medication in the patch is effective for 72 hours, after which it should be removed.  Wrap patch in a tissue and discard in the trash. Wash hands thoroughly with soap and water. 2. You may remove the patch earlier than 72 hours if you experience unpleasant side effects which may include dry mouth, dizziness or visual disturbances. 3. Avoid touching the patch. Wash your hands with soap and water after contact with the patch.   NOT TYLENOL UNTIL AFTER 4PM!!  May shower tomorrow. Wear breast binder if it feels good Follow up with Dr. Daphine DeutscherMartin at your schedule appointment.

## 2017-04-23 NOTE — Transfer of Care (Signed)
Immediate Anesthesia Transfer of Care Note  Patient: Sandy Bradshaw  Procedure(s) Performed: REMOVAL LEFT  BREAST IMPLANT--Implant discarded. (Left Breast)  Patient Location: PACU  Anesthesia Type:General  Level of Consciousness: awake, alert , oriented and patient cooperative  Airway & Oxygen Therapy: Patient Spontanous Breathing and Patient connected to face mask oxygen  Post-op Assessment: Report given to RN and Post -op Vital signs reviewed and stable  Post vital signs: Reviewed and stable  Last Vitals:  Vitals:   04/23/17 1016  BP: (!) 137/56  Pulse: 76  Resp: 16  Temp: 36.7 C  SpO2: 100%    Last Pain:  Vitals:   04/23/17 1016  TempSrc: Oral         Complications: No apparent anesthesia complications

## 2017-04-23 NOTE — Op Note (Addendum)
Sandy Bradshaw  05/17/1955 23 April 2017   PCP:  Daisy Florooss, Charles Alan, MD   Surgeon: Wenda LowMatt Takya Vandivier, MD, FACS  Asst:  none  Anes:  general  Preop Dx: Painful left breast saline implant ;  Poland's syndrome right Postop Dx: same  Procedure: Explantation of left breast prosthesis Location Surgery: CDS #8 Complications: None noted  EBL:   4 cc  Drains: none  Description of Procedure:  The patient was taken to OR 8 .  After anesthesia was administered and the patient was prepped a timeout was performed.  The left breast had been marked by me and the patient.  An inframammary incision was made in the crease.  Dr. Shon Houghruesdale had place these through a circum areolar incision.  Dissection was carried up the the capsule which was gray-white and hard.  Blunt and sharp dissection was performed medially, laterally and then anteriorly to begin the mobilization of the saline implant.  It was relieved of adhesions to the capsule posteriorly and then delivered from the wound.  A single crack was seen in the capsule anteriorly but there was no capsule seen to be remaining in the wound and the implant containing saline and capsule were felt to be removed en toto.  Cautery was used and there was not much oozing noted.  The bed was injected with 20 cc of Exparel.  The inferior margin of the capsule was approximated with a 4-0 vicryl and then layers of 4-0 vicryl before 4-0 monocryl was used on the skin.  Dermabond was applied followed by the breast binder.    The patient tolerated the procedure well and was taken to the PACU in stable condition.     Matt B. Daphine DeutscherMartin, MD, [facility], [place] [phone]

## 2017-04-23 NOTE — Anesthesia Procedure Notes (Signed)
Procedure Name: LMA Insertion Date/Time: 04/23/2017 11:41 AM Performed by: Sheryn BisonBlocker, Sindia Kowalczyk D, CRNA Pre-anesthesia Checklist: Patient identified, Emergency Drugs available, Suction available and Patient being monitored Patient Re-evaluated:Patient Re-evaluated prior to induction Oxygen Delivery Method: Circle system utilized Preoxygenation: Pre-oxygenation with 100% oxygen Induction Type: IV induction Ventilation: Mask ventilation without difficulty LMA: LMA inserted LMA Size: 4.0 Number of attempts: 1 Airway Equipment and Method: Bite block Placement Confirmation: positive ETCO2 Tube secured with: Tape Dental Injury: Teeth and Oropharynx as per pre-operative assessment

## 2017-04-23 NOTE — Interval H&P Note (Signed)
History and Physical Interval Note:  04/23/2017 11:27 AM  Sandy Bradshaw  has presented today for surgery, with the diagnosis of Left breast mastodynia  The various methods of treatment have been discussed with the patient and family. After consideration of risks, benefits and other options for treatment, the patient has consented to  Procedure(s): REMOVAL LEFT  BREAST IMPLANT ERAS PATHWAY (Left) as a surgical intervention .  The patient's history has been reviewed, patient examined, no change in status, stable for surgery.  I have reviewed the patient's chart and labs.  Questions were answered to the patient's satisfaction.     Valarie MerinoMatthew B Crystallee Werden

## 2017-04-23 NOTE — Anesthesia Postprocedure Evaluation (Signed)
Anesthesia Post Note  Patient: Alizay G RodrLuz Lexiguez  Procedure(s) Performed: REMOVAL LEFT  BREAST IMPLANT--Implant discarded. (Left Breast)     Patient location during evaluation: PACU Anesthesia Type: General Level of consciousness: awake and alert, awake, oriented and patient cooperative Pain management: pain level controlled Vital Signs Assessment: post-procedure vital signs reviewed and stable Respiratory status: spontaneous breathing, nonlabored ventilation and respiratory function stable Cardiovascular status: blood pressure returned to baseline and stable Postop Assessment: no apparent nausea or vomiting Anesthetic complications: no    Last Vitals:  Vitals:   04/23/17 1315 04/23/17 1346  BP: 137/72 (!) 146/73  Pulse: 84 86  Resp: 11 18  Temp:  36.5 C  SpO2: 95% 95%    Last Pain:  Vitals:   04/23/17 1346  TempSrc:   PainSc: 0-No pain                 Cecile HearingStephen Edward Turk

## 2017-04-26 ENCOUNTER — Encounter (HOSPITAL_BASED_OUTPATIENT_CLINIC_OR_DEPARTMENT_OTHER): Payer: Self-pay | Admitting: Surgery

## 2018-01-13 ENCOUNTER — Other Ambulatory Visit (INDEPENDENT_AMBULATORY_CARE_PROVIDER_SITE_OTHER): Payer: Self-pay

## 2018-01-13 ENCOUNTER — Ambulatory Visit (INDEPENDENT_AMBULATORY_CARE_PROVIDER_SITE_OTHER): Payer: BC Managed Care – PPO | Admitting: Orthopaedic Surgery

## 2018-01-13 ENCOUNTER — Encounter (INDEPENDENT_AMBULATORY_CARE_PROVIDER_SITE_OTHER): Payer: Self-pay | Admitting: Orthopaedic Surgery

## 2018-01-13 ENCOUNTER — Ambulatory Visit (INDEPENDENT_AMBULATORY_CARE_PROVIDER_SITE_OTHER): Payer: Self-pay

## 2018-01-13 DIAGNOSIS — M7062 Trochanteric bursitis, left hip: Secondary | ICD-10-CM

## 2018-01-13 DIAGNOSIS — M5442 Lumbago with sciatica, left side: Secondary | ICD-10-CM

## 2018-01-13 DIAGNOSIS — M25552 Pain in left hip: Secondary | ICD-10-CM | POA: Diagnosis not present

## 2018-01-13 DIAGNOSIS — M4807 Spinal stenosis, lumbosacral region: Secondary | ICD-10-CM

## 2018-01-13 DIAGNOSIS — G8929 Other chronic pain: Secondary | ICD-10-CM | POA: Diagnosis not present

## 2018-01-13 MED ORDER — METHYLPREDNISOLONE 4 MG PO TABS: ORAL_TABLET | ORAL | 0 refills | Status: DC

## 2018-01-13 NOTE — Progress Notes (Signed)
Office Visit Note   Patient: Sandy Bradshaw           Date of Birth: 06/13/1955           MRN: [id number] Visit Date: 01/13/2018              Requested by: Daisy Florooss, Charles Alan, MD [address] [place], [place] [id number] PCP: Daisy Florooss, Charles Alan, MD   Assessment & Plan: Visit Diagnoses:  1. Pain in left hip   2. Chronic left-sided low back pain with left-sided sciatica   3. Trochanteric bursitis, left hip     Plan: I do feel that is appropriate to try a steroid injection of the trochanteric area and she did wish for this as well.  She understands the risk and benefits of injections and tolerated it well.  I showed her stretching exercises to try for the trochanteric bursitis.  I am also going to try a 6-day steroid taper due to her sciatic symptoms.  An MRI is certainly warranted as well of the lumbar spine due to the decreased sensation in the L4 and L5 distribution which is slowly worsening as well as the radicular pain.  She understands this as well.  We will see her back after the MRI is performed.  All question concerns were answered and addressed.  Follow-Up Instructions:  Follow up after spine MRI  Orders:  Orders Placed This Encounter  Procedures  . XR HIP UNILAT W OR W/O PELVIS 1V LEFT  . XR Lumbar Spine 2-3 Views   Meds ordered this encounter  Medications  . methylPREDNISolone (MEDROL) 4 MG tablet    Sig: Medrol dose pack. Take as instructed    Dispense:  21 tablet    Refill:  0      Procedures: No procedures performed   Clinical Data: No additional findings.   Subjective: Chief Complaint  Patient presents with  . Left Hip - Pain  The patient is a very pleasant 62 year old female I am seeing for 2 different issues.  She is been having sciatic symptoms on the left side but also hip symptoms on the lateral aspect of her left hip.  She gets pain that goes from the side of her hip down to her knee but also she gets a separate hip pain is more in the  backside and sciatic region that goes to her calf.  She denies any numbness or tingling in her feet or any weakness in her legs.  Remotely she had 2 epidural steroid injections years ago due to a herniated disc.  That resolved for long period of time.  She denies any change in bowel bladder function.  She denies any weakness in her legs.  She is a side sleeper due to severe reflux.  This is been slowly getting worse with time.  She does not take anti-inflammatories well due to severe reflux.  HPI  Review of Systems She currently denies any headache, chest pain, shortness of breath, fever, chills, nausea, vomiting.  Objective: Vital Signs: There were no vitals taken for this visit.  Physical Exam She is alert oriented x3 and in no acute distress Ortho Exam Examination of her right hip and left hip shows fluid range of motion of both hips.  There is no blocks to rotation and no pain in the groin.  Her left hip shows severe pain on the extreme of external rotation and internal rotation but is over the trochanteric area and she has severe pain to  palpation over this area.  She does have decreased sensation on the lateral aspect of her leg and foot on the left side.  Her reflexes are equal.  She is got 5 out of 5 strength in the bilateral extremities. Specialty Comments:  No specialty comments available.  Imaging: Xr Hip Unilat W Or W/o Pelvis 1v Left  Result Date: 01/13/2018 An AP pelvis and lateral of the left hip shows a normal-appearing hip on the left side with no acute findings  Xr Lumbar Spine 2-3 Views  Result Date: 01/13/2018 2 views of the lumbar spine shows significant degenerative disc disease at L5-S1 and arthritic changes in the posterior elements.  The overall alignment is well-maintained in the AP and lateral views.    PMFS History: Patient Active Problem List   Diagnosis Date Noted  . 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 Breast implant removal status-left saline implant  removed March 2019 04/23/2017   Past Medical History:  Diagnosis Date  . Anxiety   . Arthritis    hands  . Complication of anesthesia    wakes up shaking  . GERD (gastroesophageal reflux disease)   . Mastodynia of left breast     Family History  Problem Relation Age of Onset  . Allergic rhinitis Neg Hx   . Angioedema Neg Hx   . Asthma Neg Hx   . Atopy Neg Hx   . Eczema Neg Hx   . Immunodeficiency Neg Hx   . Urticaria Neg Hx     Past Surgical History:  Procedure Laterality Date  . BREAST IMPLANT REMOVAL Left 04/23/2017   Procedure: REMOVAL LEFT  BREAST IMPLANT--Implant discarded.;  Surgeon: Luretha Murphy, MD;  Location:  SUR[facility]ERY [facility];  Service: General;  Laterality: Left;  . FOOT SURGERY Right   . HERNIA REPAIR Right   . PLACEMENT OF BREAST IMPLANTS Left 1998   Dr Shon Hough  . TUBAL LIGATION  1985   Social History   Occupational History  . Not on file  Tobacco Use  . Smoking status: Former Games developer  . Smokeless tobacco: Former Engineer, water and Sexual Activity  . Alcohol use: Yes    Comment: social  . Drug use: No  . Sexual activity: Not on file

## 2018-01-28 ENCOUNTER — Ambulatory Visit
Admission: RE | Admit: 2018-01-28 | Discharge: 2018-01-28 | Disposition: A | Discharge status: Home / Self Care | Payer: BC Managed Care – PPO | Source: Ambulatory Visit | Attending: Orthopaedic Surgery | Admitting: Orthopaedic Surgery

## 2018-01-28 DIAGNOSIS — M4807 Spinal stenosis, lumbosacral region: Secondary | ICD-10-CM

## 2018-01-31 ENCOUNTER — Telehealth (INDEPENDENT_AMBULATORY_CARE_PROVIDER_SITE_OTHER): Payer: Self-pay | Admitting: Orthopaedic Surgery

## 2018-01-31 ENCOUNTER — Encounter (INDEPENDENT_AMBULATORY_CARE_PROVIDER_SITE_OTHER): Payer: Self-pay | Admitting: Orthopaedic Surgery

## 2018-01-31 NOTE — Telephone Encounter (Signed)
See below, would like for you to call her with results if you will

## 2018-01-31 NOTE — Telephone Encounter (Signed)
Patient called and stated had MRI on 12/20 and you told her to come in 2-3 days after MRI. Offered 1st available appt on 02/07/18. Patient declined sating going on a trip for a week.  Please call patient to advise. Stated you can call her w/results. [phone]

## 2018-01-31 NOTE — Telephone Encounter (Signed)
I spoke with the patient.  She is happy to try an epidural by Dr. Newton.  PleasAlvester Morine order an left L4-L5 ESI by Dr. Alvester MorinNewton for after January 6th.  Thanks.

## 2018-02-03 ENCOUNTER — Other Ambulatory Visit (INDEPENDENT_AMBULATORY_CARE_PROVIDER_SITE_OTHER): Payer: Self-pay | Admitting: Radiology

## 2018-02-03 DIAGNOSIS — M545 Low back pain, unspecified: Secondary | ICD-10-CM

## 2018-02-03 NOTE — Telephone Encounter (Signed)
Order placed

## 2018-02-22 ENCOUNTER — Ambulatory Visit (INDEPENDENT_AMBULATORY_CARE_PROVIDER_SITE_OTHER): Payer: Self-pay

## 2018-02-22 ENCOUNTER — Ambulatory Visit (INDEPENDENT_AMBULATORY_CARE_PROVIDER_SITE_OTHER): Payer: BC Managed Care – PPO | Admitting: Physical Medicine and Rehabilitation

## 2018-02-22 ENCOUNTER — Encounter (INDEPENDENT_AMBULATORY_CARE_PROVIDER_SITE_OTHER): Payer: Self-pay | Admitting: Physical Medicine and Rehabilitation

## 2018-02-22 VITALS — BP 151/73 | HR 62 | Temp 97.6°F

## 2018-02-22 DIAGNOSIS — M5116 Intervertebral disc disorders with radiculopathy, lumbar region: Secondary | ICD-10-CM

## 2018-02-22 DIAGNOSIS — M5416 Radiculopathy, lumbar region: Secondary | ICD-10-CM | POA: Diagnosis not present

## 2018-02-22 MED ORDER — BETAMETHASONE SOD PHOS & ACET 6 (3-3) MG/ML IJ SUSP
12.0000 mg | Freq: Once | INTRAMUSCULAR | Status: AC
  Administered 2018-02-22: 12 mg

## 2018-02-22 NOTE — Patient Instructions (Signed)

## 2018-02-22 NOTE — Procedures (Signed)
Lumbosacral Transforaminal Epidural Steroid Injection - Sub-Pedicular Approach with Fluoroscopic Guidance  Patient: Sandy Bradshaw      Date of Birth: 27-Jun-1955 MRN: [id number] PCP: Daisy Floro, MD      Visit Date: 02/22/2018   Universal Protocol:    Date/Time: 02/22/2018  Consent Given By: the patient  Position: PRONE  Additional Comments: Vital signs were monitored before and after the procedure. Patient was prepped and draped in the usual sterile fashion. The correct patient, procedure, and site was verified.   Injection Procedure Details:  Procedure Site One Meds Administered:  Meds ordered this encounter  Medications  . betamethasone acetate-betamethasone sodium phosphate (CELESTONE) injection 12 mg    Laterality: Left  Location/Site:  L5-S1  Needle size: 22 G  Needle type: Spinal  Needle Placement: Transforaminal  Findings:    -Comments: Excellent flow of contrast along the nerve and into the epidural space.  Procedure Details: After squaring off the end-plates to get a true AP view, the C-arm was positioned so that an oblique view of the foramen as noted above was visualized. The target area is just inferior to the "nose of the scotty dog" or sub pedicular. The soft tissues overlying this structure were infiltrated with 2-3 ml. of 1% Lidocaine without Epinephrine.  The spinal needle was inserted toward the target using a "trajectory" view along the fluoroscope beam.  Under AP and lateral visualization, the needle was advanced so it did not puncture dura and was located close the 6 O'Clock position of the pedical in AP tracterory. Biplanar projections were used to confirm position. Aspiration was confirmed to be negative for CSF and/or blood. A 1-2 ml. volume of Isovue-250 was injected and flow of contrast was noted at each level. Radiographs were obtained for documentation purposes.   After attaining the desired flow of contrast documented above, a  0.5 to 1.0 ml test dose of 0.25% Marcaine was injected into each respective transforaminal space.  The patient was observed for 90 seconds post injection.  After no sensory deficits were reported, and normal lower extremity motor function was noted,   the above injectate was administered so that equal amounts of the injectate were placed at each foramen (level) into the transforaminal epidural space.   Additional Comments:  The patient tolerated the procedure well Dressing: Band-Aid    Post-procedure details: Patient was observed during the procedure. Post-procedure instructions were reviewed.  Patient left the clinic in stable condition.

## 2018-02-22 NOTE — Progress Notes (Signed)
 .  Numeric Pain Rating Scale and Functional Assessment Average Pain 8   In the last MONTH (on 0-10 scale) has pain interfered with the following?  1. General activity like being  able to carry out your everyday physical activities such as walking, climbing stairs, carrying groceries, or moving a chair?  Rating(5)   +Driver, -BT, -Dye Allergies.  

## 2018-02-23 NOTE — Progress Notes (Signed)
Sandy Bradshaw - 63 y.o. female MRN [id number]  Date of birth: 09/18/1955  Office Visit Note: Visit Date: 02/22/2018 PCP: Daisy Florooss, Charles Alan, MD Referred by: Daisy Florooss, Charles Alan, MD  Subjective: Chief Complaint  Patient presents with  . Lower Back - Pain  . Left Thigh - Pain   HPI:  Sandy Bradshaw is a 63 y.o. female who comes in today At the request of Dr. Doneen Poissonhristopher Blackman for left-sided epidural injection.  MRI shows mainly facet arthropathy at L4-5 with some lateral recess narrowing left more than right.  No foraminal stenosis no high-grade central stenosis no specific disc herniation.  [facility] which was a caudal injection.  She has some level of generalized anxiety.  Is having left hip and leg pain a little bit past the knee.  No paresthesias.  We will complete left elbow 5 transforaminal epidural steroid injection.  Depending on relief consider facet joint block.  Might do well with physical therapy for piriformis work.  ROS Otherwise per HPI.  Assessment & Plan: Visit Diagnoses:  1. Lumbar radiculopathy   2. Radiculopathy due to lumbar intervertebral disc disorder     Plan: No additional findings.   Meds & Orders:  Meds ordered this encounter  Medications  . betamethasone acetate-betamethasone sodium phosphate (CELESTONE) injection 12 mg    Orders Placed This Encounter  Procedures  . XR C-ARM NO REPORT  . Epidural Steroid injection    Follow-up: No follow-ups on file.   Procedures: No procedures performed  Lumbosacral Transforaminal Epidural Steroid Injection - Sub-Pedicular Approach with Fluoroscopic Guidance  Patient: Sandy Bradshaw      Date of Birth: 09/01/1955 MRN: [id number] PCP: Daisy Florooss, Charles Alan, MD      Visit Date: 02/22/2018   Universal Protocol:    Date/Time: 02/22/2018  Consent Given By: the patient  Position: PRONE  Additional Comments: Vital signs were monitored before and  after the procedure. Patient was prepped and draped in the usual sterile fashion. The correct patient, procedure, and site was verified.   Injection Procedure Details:  Procedure Site One Meds Administered:  Meds ordered this encounter  Medications  . betamethasone acetate-betamethasone sodium phosphate (CELESTONE) injection 12 mg    Laterality: Left  Location/Site:  L5-S1  Needle size: 22 G  Needle type: Spinal  Needle Placement: Transforaminal  Findings:    -Comments: Excellent flow of contrast along the nerve and into the epidural space.  Procedure Details: After squaring off the end-plates to get a true AP view, the C-arm was positioned so that an oblique view of the foramen as noted above was visualized. The target area is just inferior to the "nose of the scotty dog" or sub pedicular. The soft tissues overlying this structure were infiltrated with 2-3 ml. of 1% Lidocaine without Epinephrine.  The spinal needle was inserted toward the target using a "trajectory" view along the fluoroscope beam.  Under AP and lateral visualization, the needle was advanced so it did not puncture dura and was located close the 6 O'Clock position of the pedical in AP tracterory. Biplanar projections were used to confirm position. Aspiration was confirmed to be negative for CSF and/or blood. A 1-2 ml. volume of Isovue-250 was injected and flow of contrast was noted at each level. Radiographs were obtained for documentation purposes.   After attaining the desired flow of contrast documented above, a 0.5 to 1.0 ml test dose of 0.25% Marcaine was injected into  each respective transforaminal space.  The patient was observed for 90 seconds post injection.  After no sensory deficits were reported, and normal lower extremity motor function was noted,   the above injectate was administered so that equal amounts of the injectate were placed at each foramen (level) into the transforaminal epidural  space.   Additional Comments:  The patient tolerated the procedure well Dressing: Band-Aid    Post-procedure details: Patient was observed during the procedure. Post-procedure instructions were reviewed.  Patient left the clinic in stable condition.     Clinical History: MRI LUMBAR SPINE WITHOUT CONTRAST  TECHNIQUE: Multiplanar, multisequence MR imaging of the lumbar spine was performed. No intravenous contrast was administered.  COMPARISON:  Lumbar radiographs 01/13/2018  FINDINGS: Segmentation:  Normal  Alignment:  Mild retrolisthesis T12-L1, L2-3  Vertebrae: Negative for fracture or mass. Discogenic bone marrow changes with edema at T12-L1 anteriorly related to disc space narrowing.  Conus medullaris and cauda equina: Conus extends to the L1-2 level. Conus and cauda equina appear normal.  Paraspinal and other soft tissues: Negative for paraspinous mass or adenopathy.  Disc levels:  T12-L1: Shallow central disc protrusion extending to the left. Mild cord flattening without spinal stenosis  L1-2: Disc degeneration with disc bulging. Negative for disc protrusion or stenosis  L2-3: Mild retrolisthesis with mild disc and mild facet degeneration. Negative for disc protrusion or stenosis.  L2-3: Mild disc and mild facet degeneration without significant stenosis  L4-5: Moderate facet degeneration. Mild disc degeneration and disc bulging. Mild subarticular stenosis bilaterally could affect the L5 nerve root bilaterally.  L5-S1: Mild facet degeneration. Normal disc space. Negative for stenosis  IMPRESSION: Mild lumbar degenerative changes. Mild subarticular stenosis bilaterally at L4-5 which could cause impingement of the descending L5 nerve root bilaterally   Electronically Signed   By: Marlan Palauharles  Clark M.D.   On: 01/29/2018 07:05     Objective:  VS:  HT:    WT:   BMI:     BP:(!) 151/73  HR:62bpm  TEMP:97.6 F (36.4 C)(Oral)   RESP:  Physical Exam  Ortho Exam Imaging: Xr C-arm No Report  Result Date: 02/22/2018 Please see Notes tab for imaging impression.

## 2018-03-01 ENCOUNTER — Encounter (INDEPENDENT_AMBULATORY_CARE_PROVIDER_SITE_OTHER): Payer: Self-pay | Admitting: Physical Medicine and Rehabilitation

## 2018-12-09 ENCOUNTER — Other Ambulatory Visit: Payer: Self-pay

## 2018-12-09 DIAGNOSIS — Z20822 Contact with and (suspected) exposure to covid-19: Secondary | ICD-10-CM

## 2018-12-10 LAB — NOVEL CORONAVIRUS, NAA: SARS-CoV-2, NAA: NOT DETECTED

## 2019-09-14 ENCOUNTER — Other Ambulatory Visit: Payer: Self-pay | Admitting: Surgery

## 2019-09-14 DIAGNOSIS — R1031 Right lower quadrant pain: Secondary | ICD-10-CM

## 2019-09-20 ENCOUNTER — Other Ambulatory Visit: Payer: Self-pay

## 2019-09-20 ENCOUNTER — Ambulatory Visit
Admission: RE | Admit: 2019-09-20 | Discharge: 2019-09-20 | Disposition: A | Discharge status: Home / Self Care | Payer: Medicare PPO | Source: Ambulatory Visit | Attending: Surgery | Admitting: Surgery

## 2019-09-20 ENCOUNTER — Other Ambulatory Visit
Admission: RE | Admit: 2019-09-20 | Discharge: 2019-09-20 | Disposition: A | Discharge status: Home / Self Care | Payer: Medicare PPO | Source: Ambulatory Visit | Attending: Surgery | Admitting: Surgery

## 2019-09-20 DIAGNOSIS — R1031 Right lower quadrant pain: Secondary | ICD-10-CM | POA: Insufficient documentation

## 2019-09-20 LAB — CREATININE, SERUM
Creatinine, Ser: 0.79 mg/dL (ref 0.44–1.00)
GFR calc Af Amer: >60 mL/min (ref >60)
GFR calc non Af Amer: >60 mL/min (ref >60)

## 2019-09-20 MED ORDER — IOHEXOL 300 MG/ML  SOLN
100.0000 mL | Freq: Once | INTRAMUSCULAR | Status: AC | PRN
  Administered 2019-09-20: 11:00:00 100 mL via INTRAVENOUS

## 2020-02-05 ENCOUNTER — Other Ambulatory Visit: Payer: Self-pay

## 2020-02-05 ENCOUNTER — Ambulatory Visit
Admission: EM | Admit: 2020-02-05 | Discharge: 2020-02-05 | Disposition: A | Discharge status: Home / Self Care | Payer: Medicare PPO | Attending: Emergency Medicine | Admitting: Emergency Medicine

## 2020-02-05 DIAGNOSIS — J029 Acute pharyngitis, unspecified: Secondary | ICD-10-CM

## 2020-02-05 DIAGNOSIS — R519 Headache, unspecified: Secondary | ICD-10-CM | POA: Diagnosis present

## 2020-02-05 LAB — POCT RAPID STREP A (OFFICE): Rapid Strep A Screen: NEGATIVE

## 2020-02-05 NOTE — Discharge Instructions (Addendum)
Your rapid strep test was negative today.  The culture is pending.  Please look on your MyChart for test results.   We will notify you if the culture positive and outline a treatment plan at that time.   Please continue Tylenol and/or Ibuprofen as needed for fever, pain.  May try warm salt water gargles, cepacol lozenges, throat spray, warm tea or water with lemon/honey, or OTC cold relief medicine for throat discomfort.  May gargle, spit viscous lidocaine as prescribed for additional relief.  (Please note this may cause the back of your tongue and mouth to be numb as well)  For congestion: take a daily anti-histamine like Zyrtec, Claritin, and a oral decongestant to help with post nasal drip that may be irritating your throat.   It is important to stay hydrated: drink plenty of fluids (primarily water) to keep your throat moisturized and help further relieve irritation/discomfort.  

## 2020-02-05 NOTE — ED Triage Notes (Signed)
Patient states she has had a sore throat, but no other symptoms, since yesterday. Pt is concerned about strep. Pt is aox4 and ambulatory.

## 2020-02-05 NOTE — ED Provider Notes (Signed)
[facility]    CSN: [id number] Arrival date & time: 02/05/20  7829      History   Chief Complaint Chief Complaint  Patient presents with  . Sore Throat    Since yesterday    HPI Sandy Bradshaw is a 64 y.o. female  History was provided by the patient. Sandy Bradshaw is a 64 y.o. female who presents for evaluation of a sore throat. Associated symptoms include frontal headache. Onset of symptoms was 1 day ago, unchanged since that time.  She is drinking plenty of fluids. She has not had recent close exposure to someone with proven streptococcal pharyngitis.  Is covid vaccinated, booster pending. The following portions of the patient's history were reviewed and updated as appropriate: allergies, current medications, past family history, past medical history, past social history, past surgical history and problem list.     Past Medical History:  Diagnosis Date  . Anxiety   . Arthritis    hands  . Complication of anesthesia    wakes up shaking  . GERD (gastroesophageal reflux disease)   . Mastodynia of left breast     Patient Active Problem List   Diagnosis Date Noted  . 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 Breast implant removal status-left saline implant removed March 2019 04/23/2017    Past Surgical History:  Procedure Laterality Date  . BREAST IMPLANT REMOVAL Left 04/23/2017   Procedure: REMOVAL LEFT  BREAST IMPLANT--Implant discarded.;  Surgeon: Luretha Murphy, MD;  Location: [facility];  Service: General;  Laterality: Left;  . FOOT SURGERY Right   . HERNIA REPAIR Right   . PLACEMENT OF BREAST IMPLANTS Left 1998   Dr Shon Hough  . TUBAL LIGATION  1985    OB History   No obstetric history on file.      Home Medications    Prior to Admission medications   Medication Sig Start Date End Date Taking? Authorizing Provider  Probiotic Product (PROBIOTIC ADVANCED) CAPS Take by mouth.   Yes [provider]   ALPRAZolam Prudy Feeler) 1 MG tablet Take 1 mg by mouth at bedtime as needed for anxiety.    [provider]  methylcellulose oral powder Take by mouth daily.    [provider]  methylPREDNISolone (MEDROL) 4 MG tablet Medrol dose pack. Take as instructed 01/13/18   Kathryne Hitch, MD    Family History Family History  Problem Relation Age of Onset  . Allergic rhinitis Neg Hx   . Angioedema Neg Hx   . Asthma Neg Hx   . Atopy Neg Hx   . Eczema Neg Hx   . Immunodeficiency Neg Hx   . Urticaria Neg Hx     Social History Social History   Tobacco Use  . Smoking status: Former Games developer  . Smokeless tobacco: Former Clinical biochemist  . Vaping Use: Never used  Substance Use Topics  . Alcohol use: Yes    Comment: social  . Drug use: No     Allergies   Reglan [metoclopramide] and Adhesive [tape]   Review of Systems Review of Systems  Constitutional: Negative for fatigue and fever.  HENT: Positive for sore throat. Negative for congestion, dental problem, ear pain, facial swelling, hearing loss, sinus pain, trouble swallowing and voice change.   Eyes: Negative for photophobia, pain and visual disturbance.  Respiratory: Negative for cough and shortness of breath.   Cardiovascular: Negative for chest pain and palpitations.  Gastrointestinal: Negative for diarrhea and vomiting.  Musculoskeletal:  Negative for arthralgias and myalgias.  Neurological: Positive for headaches. Negative for dizziness.     Physical Exam Triage Vital Signs ED Triage Vitals  Enc Vitals Group     BP 02/05/20 1059 126/75     Pulse Rate 02/05/20 1059 70     Resp 02/05/20 1059 20     Temp 02/05/20 1059 97.6 F (36.4 C)     Temp Source 02/05/20 1059 Oral     SpO2 02/05/20 1059 97 %     Weight --      Height --      Head Circumference --      Peak Flow --      Pain Score 02/05/20 1119 3     Pain Loc --      Pain Edu? --      Excl. in GC? --    No data found.  Updated Vital  Signs BP 126/75 (BP Location: Left Arm)   Pulse 70   Temp 97.6 F (36.4 C) (Oral)   Resp 20   SpO2 97%   Visual Acuity Right Eye Distance:   Left Eye Distance:   Bilateral Distance:    Right Eye Near:   Left Eye Near:    Bilateral Near:     Physical Exam Constitutional:      General: She is not in acute distress.    Appearance: She is well-developed. She is not ill-appearing or diaphoretic.  HENT:     Head: Normocephalic and atraumatic.     Right Ear: Tympanic membrane and ear canal normal.     Left Ear: Tympanic membrane and ear canal normal.     Mouth/Throat:     Mouth: Mucous membranes are moist.     Pharynx: Oropharynx is clear. Uvula midline. No oropharyngeal exudate, posterior oropharyngeal erythema or uvula swelling.     Tonsils: No tonsillar exudate. 1+ on the right. 1+ on the left.  Eyes:     General: No scleral icterus.    Conjunctiva/sclera: Conjunctivae normal.     Pupils: Pupils are equal, round, and reactive to light.  Neck:     Comments: Trachea midline, negative JVD Cardiovascular:     Rate and Rhythm: Normal rate and regular rhythm.     Heart sounds: No murmur heard. No gallop.   Pulmonary:     Effort: Pulmonary effort is normal. No respiratory distress.     Breath sounds: No wheezing, rhonchi or rales.  Musculoskeletal:     Cervical back: Neck supple. No tenderness.  Lymphadenopathy:     Cervical: No cervical adenopathy.  Skin:    Capillary Refill: Capillary refill takes less than 2 seconds.     Coloration: Skin is not jaundiced or pale.     Findings: No rash.  Neurological:     General: No focal deficit present.     Mental Status: She is alert and oriented to person, place, and time.      UC Treatments / Results  Labs (all labs ordered are listed, but only abnormal results are displayed) Labs Reviewed  CULTURE, GROUP A STREP ([facility])  NOVEL CORONAVIRUS, NAA  POCT RAPID STREP A (OFFICE)    EKG   Radiology No results  found.  Procedures Procedures (including critical care time)  Medications Ordered in UC Medications - No data to display  Initial Impression / Assessment and Plan / UC Course  I have reviewed the triage vital signs and the nursing notes.  Pertinent labs & imaging results that were  available during my care of the patient were reviewed by me and considered in my medical decision making (see chart for details).     Patient afebrile, nontoxic, with SpO2 97%.  Requesting strep test: Negative, culture pending.  Covid PCR pending.  Patient to quarantine until results are back.  We will treat supportively as outlined below.  Return precautions discussed, patient verbalized understanding and is agreeable to plan. Final Clinical Impressions(s) / UC Diagnoses   Final diagnoses:  Frontal headache  Sore throat     Discharge Instructions     Your rapid strep test was negative today.  The culture is pending.  Please look on your MyChart for test results.   We will notify you if the culture positive and outline a treatment plan at that time.   Please continue Tylenol and/or Ibuprofen as needed for fever, pain.  May try warm salt water gargles, cepacol lozenges, throat spray, warm tea or water with lemon/honey, or OTC cold relief medicine for throat discomfort.  May gargle, spit viscous lidocaine as prescribed for additional relief.  (Please note this may cause the back of your tongue and mouth to be numb as well)  For congestion: take a daily anti-histamine like Zyrtec, Claritin, and a oral decongestant to help with post nasal drip that may be irritating your throat.   It is important to stay hydrated: drink plenty of fluids (primarily water) to keep your throat moisturized and help further relieve irritation/discomfort.     ED Prescriptions    None     PDMP not reviewed this encounter.   Hall[place]vin, [place], [place] 02/05/20 1220

## 2020-02-06 LAB — SARS-COV-2, NAA 2 DAY TAT

## 2020-02-06 LAB — NOVEL CORONAVIRUS, NAA: SARS-CoV-2, NAA: NOT DETECTED

## 2020-02-07 LAB — CULTURE, GROUP A STREP (THRC)

## 2020-08-21 IMAGING — CT CT ABD-PELV W/ CM
2 of 5 series · 16 of 46 positions shown, 18 images · IV contrast (APPLIED)
Comparison: November 08, 2009

CLINICAL DATA: RIGHT lower quadrant pain.

EXAM:
CT ABDOMEN AND PELVIS WITH CONTRAST
TECHNIQUE: Multidetector CT imaging of the abdomen and pelvis was performed
using the standard protocol following bolus administration of
intravenous contrast.
CONTRAST:  100mL OMNIPAQUE IOHEXOL 300 MG/ML  SOLN

[Series 2: routine abd/pel with · axial · 0.74mm/px · z∈[-1052,-642]mm · 13 of 92 slices shown, 15 images]
[im 5/92  soft-tissue]
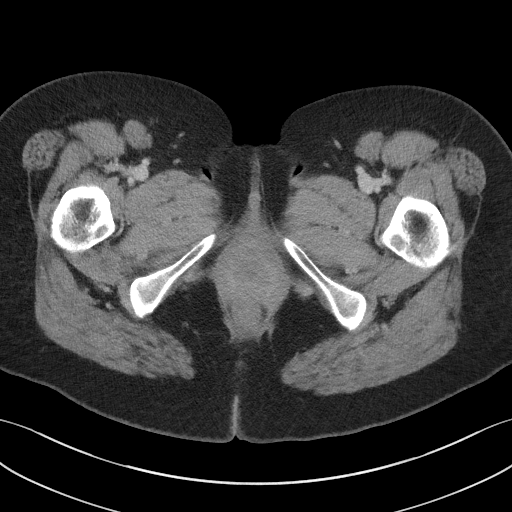
[im 5/92  bone]
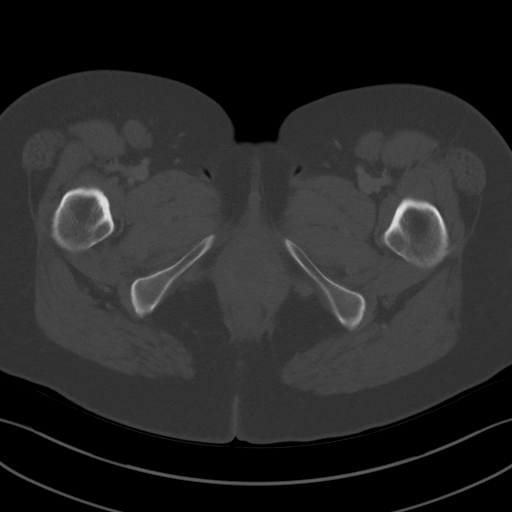
[im 15/92  soft-tissue]
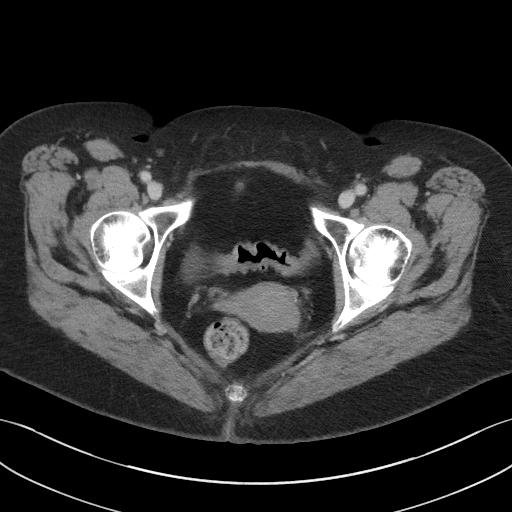
[im 20/92  soft-tissue]
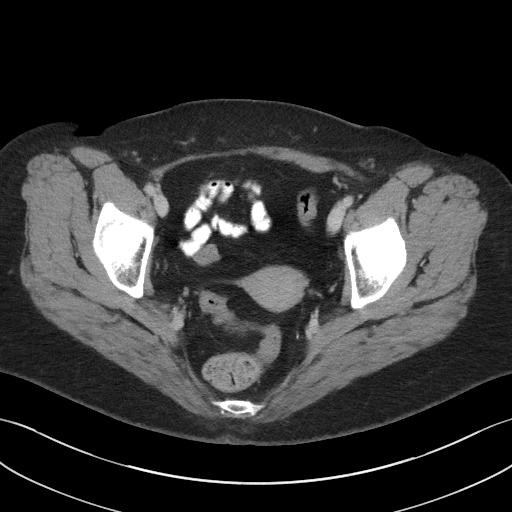
[im 24/92  soft-tissue]
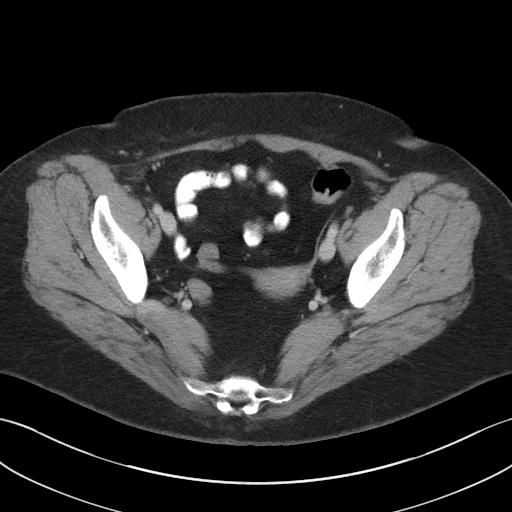
[im 34/92  soft-tissue]
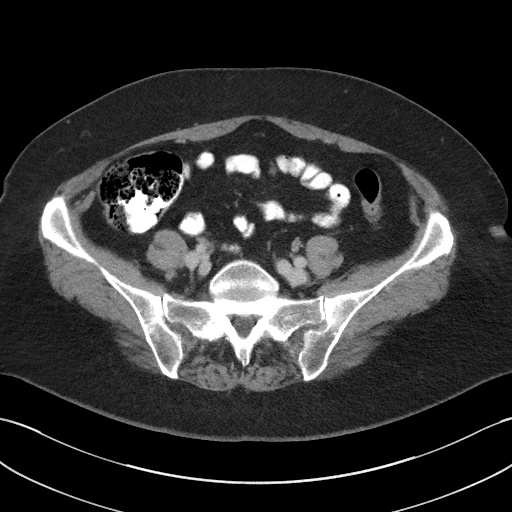
[im 39/92  soft-tissue]
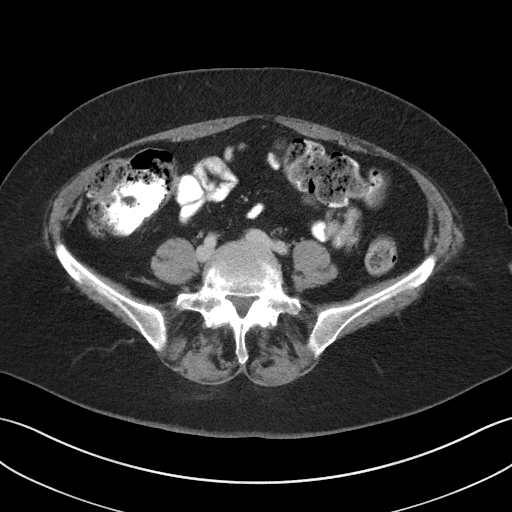
[im 48/92  soft-tissue]
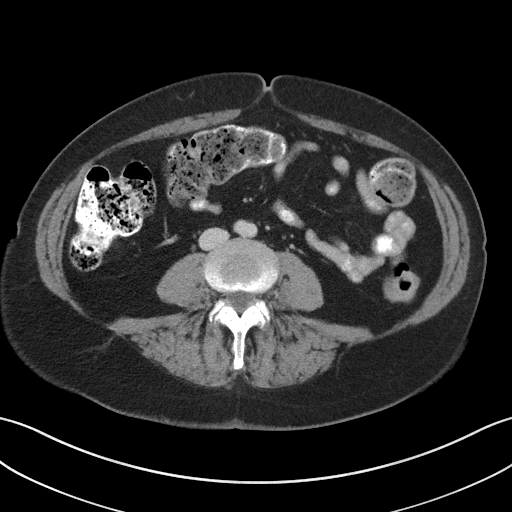
[im 53/92  soft-tissue]
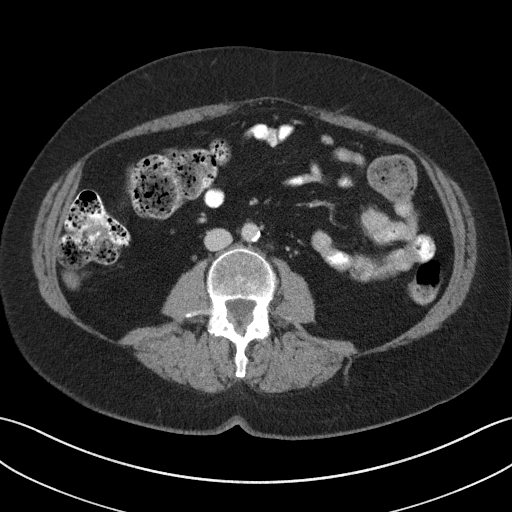
[im 58/92  soft-tissue]
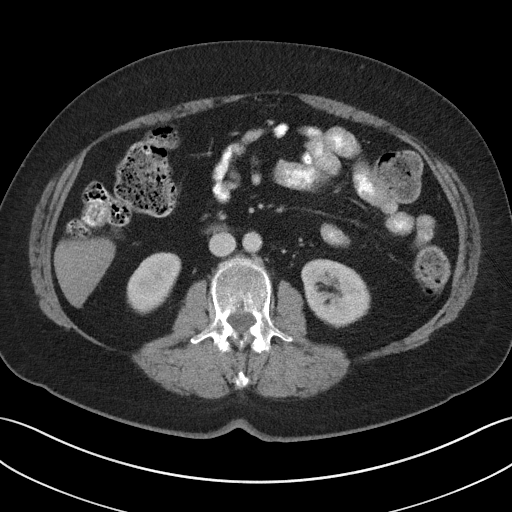
[im 58/92  bone]
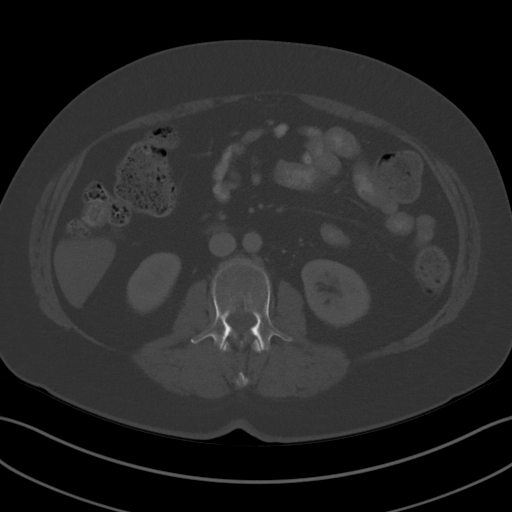
[im 68/92  soft-tissue]
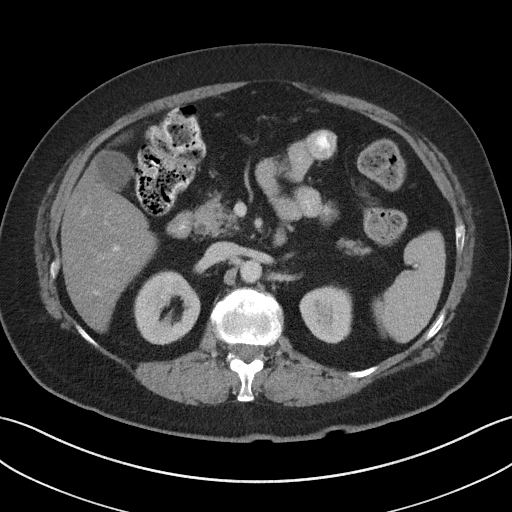
[im 72/92  soft-tissue]
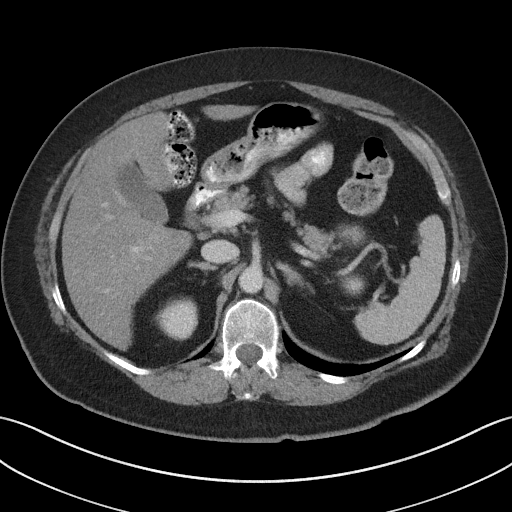
[im 77/92  soft-tissue]
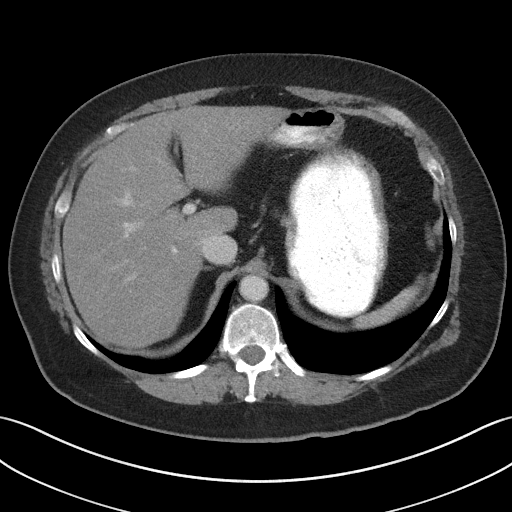
[im 87/92  soft-tissue]
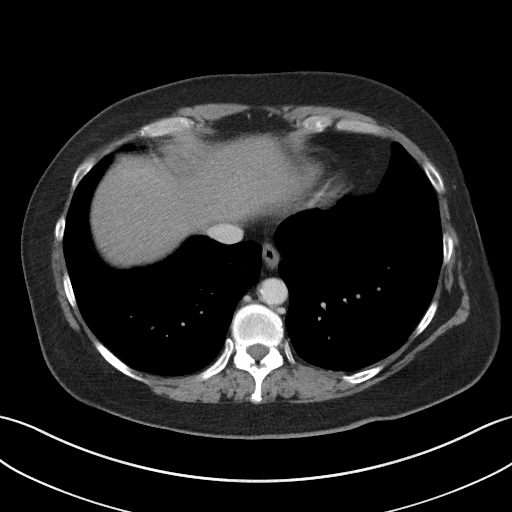

[Series 5: coronal st · coronal · 0.80mm/px · 3 of 102 slices shown]
[im 34/102  soft-tissue]
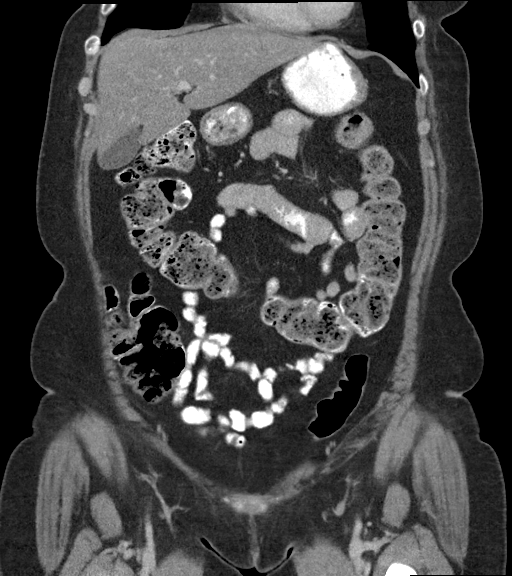
[im 45/102  soft-tissue]
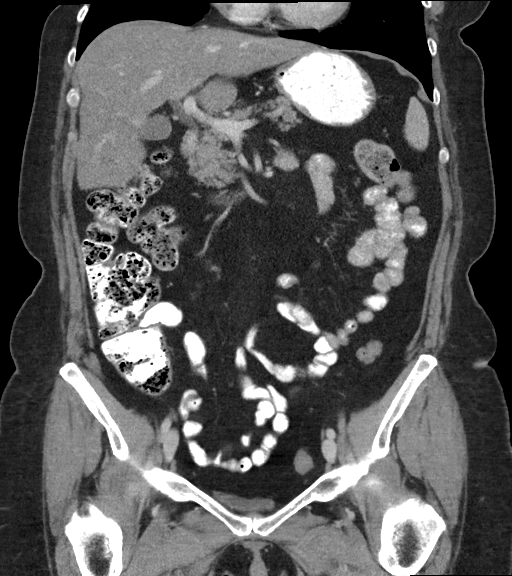
[im 57/102  soft-tissue]
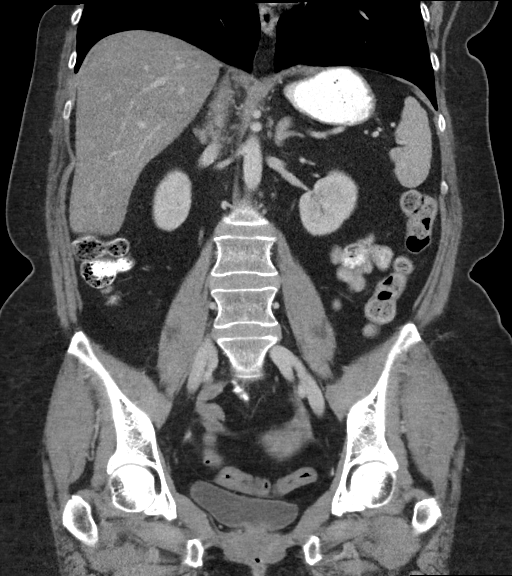

[16 of 46 positions shown; findings below may reference images not displayed]

FINDINGS: Lower chest: No acute abnormality.

Hepatobiliary: Likely hepatic steatosis. Gallbladder is
unremarkable. No suspicious focal lesion. No intrahepatic or
extrahepatic biliary ductal dilation.

Pancreas: Unremarkable. No pancreatic ductal dilatation or
surrounding inflammatory changes.

Spleen: Unchanged subcentimeter hypodense lesion of the hilum,
benign.

Adrenals/Urinary Tract: Adrenal glands are unremarkable. Kidneys are
normal, without renal calculi, focal lesion, or hydronephrosis.
Bladder is unremarkable.

Stomach/Bowel: Stomach is within normal limits. Appendix appears
normal. No evidence of bowel wall thickening, distention, or
inflammatory changes. Moderate predominately RIGHT-sided colonic
stool burden.

Vascular/Lymphatic: Mild aortic atherosclerosis. No enlarged
abdominal or pelvic lymph nodes.

Reproductive: Uterus and bilateral adnexa are unremarkable. Decrease
in size of subcentimeter LEFT adnexal cyst since prior.

Other: No abdominal wall hernia or abnormality. No abdominopelvic
ascites.

Musculoskeletal: No acute or significant osseous findings.
IMPRESSION: 1. No acute abdominopelvic findings. Normal appendix.
2. Moderate predominately RIGHT-sided colonic stool burden.

Aortic Atherosclerosis (OUUKX-OO1.1).

## 2021-05-29 ENCOUNTER — Encounter: Payer: Self-pay | Admitting: Physician Assistant

## 2021-05-29 ENCOUNTER — Ambulatory Visit (INDEPENDENT_AMBULATORY_CARE_PROVIDER_SITE_OTHER): Payer: Medicare PPO

## 2021-05-29 ENCOUNTER — Ambulatory Visit: Payer: Medicare PPO | Admitting: Physician Assistant

## 2021-05-29 DIAGNOSIS — M25552 Pain in left hip: Secondary | ICD-10-CM

## 2021-05-29 DIAGNOSIS — M7062 Trochanteric bursitis, left hip: Secondary | ICD-10-CM

## 2021-05-29 MED ORDER — LIDOCAINE HCL 1 % IJ SOLN
3.0000 mL | INTRAMUSCULAR | Status: AC | PRN
  Administered 2021-05-29: 3 mL

## 2021-05-29 MED ORDER — METHYLPREDNISOLONE ACETATE 40 MG/ML IJ SUSP
40.0000 mg | INTRAMUSCULAR | Status: AC | PRN
  Administered 2021-05-29: 40 mg via INTRA_ARTICULAR

## 2021-05-29 NOTE — Progress Notes (Signed)
? ?Office Visit Note ?  ?Patient: Sandy Bradshaw           ?Date of Birth: Feb 09, 1956           ?MRN: [id number] ?Visit Date: 05/29/2021 ?             ?Requested by: No referring provider defined for this encounter. ?PCP: Patient, No Pcp Per (Inactive) ? ? ?Assessment & Plan: ?Visit Diagnoses:  ?1. Pain in left hip   ? ? ?Plan: She shown IT band stretching exercises.  She will continue her back exercises.  Follow-up with [place] as needed.  Questions encouraged and answered at length today. ? ?Follow-Up Instructions: Return if symptoms worsen or fail to improve.  ? ?Orders:  ?Orders Placed This Encounter  ?Procedures  ? Large Joint Inj: L greater trochanter  ? XR Lumbar Spine 2-3 Views  ? XR HIP UNILAT W OR W/O PELVIS 2-3 VIEWS LEFT  ? ?No orders of the defined types were placed in this encounter. ? ? ? ? Procedures: ?Large Joint Inj: L greater trochanter on 05/29/2021 2:05 PM ?Indications: pain ?Details: 22 G 1.5 in needle, lateral approach ? ?Arthrogram: No ? ?Medications: 3 mL lidocaine 1 %; 40 mg methylPREDNISolone acetate 40 MG/ML ?Outcome: tolerated well, no immediate complications ?Procedure, treatment alternatives, risks and benefits explained, specific risks discussed. Consent was given by the patient. Immediately prior to procedure a time out was called to verify the correct patient, procedure, equipment, support staff and site/side marked as required. Patient was prepped and draped in the usual sterile fashion.  ? ? ? ? ?Clinical Data: ?No additional findings. ? ? ?Subjective: ?Chief Complaint  ?Patient presents with  ? Lower Back - Pain  ? Left Hip - Pain  ? ? ?HPI ?Sandy Bradshaw comes in today for left hip pain.  She states she has had hip pain for years.  She was last seen by Dr. Raye Sorrow in 2019 and gave her a trochanteric injection which helped until few weeks ago when she developed left hip pain.  She denies any numbness tingling down right leg.  No back pain.  She does states she has some groin pain but  most pain is lateral aspect of the hip.  She has had no injuries no falls.  Increased pain with walking also.  She is nondiabetic. ?Review of Systems ?Negative for fevers chills ? ?Objective: ?Vital Signs: There were no vitals taken for this visit. ? ?Physical Exam ?Constitutional:   ?   Appearance: She is not ill-appearing or diaphoretic.  ?Pulmonary:  ?   Effort: Pulmonary effort is normal.  ?Neurological:  ?   Mental Status: She is alert and oriented to person, place, and time.  ?Psychiatric:     ?   Mood and Affect: Mood normal.  ? ? ?Ortho Exam ?Bilateral hips full range of motion without pain.  Tenderness over the trochanteric region left greater than right hip.  5 5 strength throughout lower extremities against resistance.  Negative straight leg raise bilaterally.  Tight hamstrings bilaterally. ?Specialty Comments:  ?No specialty comments available. ? ?Imaging: ?XR HIP UNILAT W OR W/O PELVIS 2-3 VIEWS LEFT ? ?Result Date: 05/29/2021 ?AP pelvis lateral view left hip: No acute fractures.  Bilateral hips well located.  Bilateral hips well-maintained.  No bony abnormalities. ? ?XR Lumbar Spine 2-3 Views ? ?Result Date: 05/29/2021 ?Lumbar spine 2 views: No acute fractures no spondylolisthesis.  Loss of disc space at T12-L1.  Lower lumbar facet arthritis significant.  Normal lordotic curvature.  Arthrosclerosis aorta.  ? ? ?PMFS History: ?Patient Active Problem List  ? Diagnosis Date Noted  ? 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 Breast implant removal status-left saline implant removed March 2019 04/23/2017  ? ?Past Medical History:  ?Diagnosis Date  ? Anxiety   ? Arthritis   ? hands  ? Complication of anesthesia   ? wakes up shaking  ? GERD (gastroesophageal reflux disease)   ? Mastodynia of left breast   ?  ?Family History  ?Problem Relation Age of Onset  ? Allergic rhinitis Neg Hx   ? Angioedema Neg Hx   ? Asthma Neg Hx   ? Atopy Neg Hx   ? Eczema Neg Hx   ? Immunodeficiency Neg Hx   ? Urticaria Neg Hx   ?  ?Past  Surgical History:  ?Procedure Laterality Date  ? BREAST IMPLANT REMOVAL Left 04/23/2017  ? Procedure: REMOVAL LEFT  BREAST IMPLANT--Implant discarded.;  Surgeon: Luretha Murphy, MD;  Location: [facility];  Service: General;  Laterality: Left;  ? FOOT SURGERY Right   ? HERNIA REPAIR Right   ? PLACEMENT OF BREAST IMPLANTS Left 1998  ? Dr Shon Hough  ? TUBAL LIGATION  1985  ? ?Social History  ? ?Occupational History  ? Not on file  ?Tobacco Use  ? Smoking status: Former  ? Smokeless tobacco: Former  ?Vaping Use  ? Vaping Use: Never used  ?Substance and Sexual Activity  ? Alcohol use: Yes  ?  Comment: social  ? Drug use: No  ? Sexual activity: Not Currently  ?  Birth control/protection: Post-menopausal  ? ? ? ? ? ? ?

## 2021-07-21 ENCOUNTER — Encounter: Payer: Self-pay | Admitting: Physician Assistant

## 2021-07-21 ENCOUNTER — Ambulatory Visit: Payer: Medicare PPO | Admitting: Physician Assistant

## 2021-07-21 DIAGNOSIS — M7062 Trochanteric bursitis, left hip: Secondary | ICD-10-CM | POA: Diagnosis not present

## 2021-07-21 NOTE — Addendum Note (Signed)
Addended by: Barbette Or on: 07/21/2021 03:58 PM   Modules accepted: Orders

## 2021-07-21 NOTE — Progress Notes (Signed)
HPI: [name] returns today due to left hip pain.  She states the injection on 05/29/2021 helped with she still sore over the lateral aspect of the hip.  She has been taking Advil but this pain causes some GI upset.  She denies any radicular symptoms down the leg.  Review of systems: See HPI otherwise negative or noncontributory.  Physical exam: General well-developed well-nourished female who ambulates without any assistive device.  Left hip good range of motion without pain.  Tenderness over the left trochanteric region and over the proximal IT band.  Right hip nontender over the trochanteric region.  Impression: Left hip trochanteric bursitis   Plan: We will send her to physical therapy for IT band stretching, modalities and home exercise program.  She will follow-up with [place] on an as-needed basis pain persist or becomes worse.  Questions were encouraged and answered

## 2021-07-24 ENCOUNTER — Encounter: Payer: Self-pay | Admitting: Physical Therapy

## 2021-07-24 ENCOUNTER — Ambulatory Visit: Payer: Medicare PPO | Attending: Physician Assistant | Admitting: Physical Therapy

## 2021-07-24 DIAGNOSIS — M25552 Pain in left hip: Secondary | ICD-10-CM | POA: Diagnosis present

## 2021-07-24 DIAGNOSIS — M7062 Trochanteric bursitis, left hip: Secondary | ICD-10-CM | POA: Diagnosis not present

## 2021-07-24 NOTE — Therapy (Signed)
[place] [facility] [address], [place], [address] Phone: [phone]   Fax:  [phone]  Physical Therapy Evaluation  Patient Details  Name: Sandy Bradshaw MRN: [id number] Date of Birth: 1955/06/16 No data recorded  Encounter Date: 07/24/2021   PT End of Session - 07/24/21 0959     Visit Number 1    Number of Visits 17    Date for PT Re-Evaluation 09/19/21    Authorization - Visit Number 1    Authorization - Number of Visits 10    Progress Note Due on Visit 10    PT Start Time 0900    PT Stop Time 0950    PT Time Calculation (min) 50 min    Activity Tolerance Patient tolerated treatment well    Behavior During Therapy [facility] for tasks assessed/performed             Past Medical History:  Diagnosis Date   Anxiety    Arthritis    hands   Complication of anesthesia    wakes up shaking   GERD (gastroesophageal reflux disease)    Mastodynia of left breast     Past Surgical History:  Procedure Laterality Date   BREAST IMPLANT REMOVAL Left 04/23/2017   Procedure: REMOVAL LEFT  BREAST IMPLANT--Implant discarded.;  Surgeon: Luretha Murphy, MD;  Location: [facility];  Service: General;  Laterality: Left;   FOOT SURGERY Right    HERNIA REPAIR Right    PLACEMENT OF BREAST IMPLANTS Left 1998   Dr Shon Hough   TUBAL LIGATION  1985    There were no vitals filed for this visit.    Subjective Assessment - 07/24/21 0904     Pertinent History Pt is a 66 year old female presenting with L hip pain that began insideously "a couple months ago". She had the same pain in 2019 as well. Had a predinsone injection about a month ago that improved pain but is still having pain at (points to) proximal ITB. Pain is dull and nagging in nature, worst pain in past week 5/10; best 0/10; current 2/10. No radiating pain or n/t.  Pain is Aggravating factors walking >5-74mins on concrete, standing >68mins, sitting  with pressure on L side. Relieving factors advil (but upsets her tummy), temporary relief from icy-hot. 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 Pt lives with her husband, and enjoys sewing.  Pt denies N/V, B&B changes, unexplained weight fluctuation, saddle paresthesia, fever, night sweats, or unrelenting night pain at this time.    Limitations Lifting;Walking;Sitting;Standing;House hold activities    How long can you sit comfortably? limited in office chair to 10-19mins    How long can you stand comfortably?    How long can you walk comfortably? 5-58mins on hard floors    Diagnostic tests XRAY negative    Patient Stated Goals go back to all my activities without pain    Currently in Pain? Yes    Pain Score 2     Pain Location Hip    Pain Orientation Lateral;Left    Pain Descriptors / Indicators Aching;Nagging    Pain Type Chronic pain    Pain Radiating Towards none    Pain Onset More than a month ago    Pain Frequency Intermittent    Aggravating Factors  walking >  5-38mins on concrete, standing >77mins, sitting with pressure on L side.    Pain Relieving Factors factors advil (but upsets her tummy), temporary relief from icy-hot    Effect of Pain on Daily Activities sitting in desk chair at work, complete community ambulation walking             OBJECTIVE  Mental Status Patient is oriented to person, place and time.  Recent memory is intact.  Remote memory is intact.  Attention span and concentration are intact.  Expressive speech is intact.  Patient's fund of knowledge is within normal limits for educational level.   Gross Musculoskeletal Assessment Tremor: None Bulk: Normal Tone: Normal No visible step-off along spinal column    Gait [place] syndrome with R shoulder hike (no pec major) likely contributing to L lateral trunk  lean. Decreased LLE stance time, decreased RLE step length Normal gait speed: 1.28m/s  Fastest speed 1.51m/s   Posture Wt shifted to R d/t pain with increased iliac crest height, able to correct Increased R shoulder height d/t [place] syndrome, subsequent lateral trunk lean   AROM (degrees) R/L (all movements include overpressure unless otherwise stated) All lumbar/thoracic mobility WNL All hip mobility WNL, pain with end range IR on R  *Indicates pain   PROM (degrees) PROM = AROM With increased pain with overpressure IR On R hip   Repeated Movements No centralization or peripheralization of symptoms with repeated lumbar extension or flexion.    Strength (out of 5) R/L 5/5 Hip flexion 4+/4+ Hip ER 5/5 Hip IR 5/4 Hip abduction 5/5 Hip adduction 4+/4 Hip extension 5/5 Knee extension 5/5 Knee flexion 5/5 Ankle dorsiflexion 5/5 Ankle plantarflexion 5 Trunk flexion 5 Trunk extension 5/5 Trunk rotation *Indicates pain   Sensation Grossly intact to light touch bilateral LEs as determined by testing dermatomes L2-S2. Proprioception and hot/cold testing deferred on this date.   Palpation TTP at greater trochanter 3; with concordant pain sign and score of 4 to proximal ITB. TTP score of 4 with palpable trigger points to superior glute max and glute med.  Muscle Length Hamstrings: WNL bilat  Thomas: WNL bilat Ober: shortened L     Passive Accessory Intervertebral Motion (PAIVM) Pt denies reproduction of back pain with CPA L1-L5 and UPA bilaterally L1-L5. Generally hypomobile throughout    SPECIAL TESTS Lumbar Radiculopathy and Discogenic: Centralization and Peripheralization (SN 92, -LR 0.12): Negative Slump (SN 83, -LR 0.32): R: Not examined L: Not examined SLR (SN 92, -LR 0.29): R: Negative L:  Negative Crossed SLR (SP 90): R: Negative L: Negative    Hip: FABER (SN 81): R: Negative L: Negative FADIR (SN 94): R: Positive L: Negative Hip scour (SN 50): R:  Negative L: Negative   SIJ:  Thigh Thrust (SN 88, -LR 0.18) : R: Negative L: Negative   Piriformis Syndrome: FAIR Test (SN 88, SP 83): R: Positive L: Negative   Functional Tasks Deep squat: R shift with lower with R knee valgus and midfoot pronation; maintained thoracic/shoulder posture throughout functional squat 5xSTS test 13sec 10sec SLS R: 17sec L: 6sec Sit to stand: Bilat knee valgus, hand push off, increased RLE WB  Ther-Ex PT reviewed the following HEP with patient with patient able to demonstrate a set of the following with min cuing for correction needed. PT educated patient on parameters of therex (how/when to inc/decrease intensity, frequency, rep/set range, stretch hold time, and purpose of therex) with verbalized understanding.   Access Code: [email or web address] - Seated Figure 4 Piriformis Stretch  -  2-3 x daily - 7 x weekly - 30-60sec hold - Sidelying glute min/med Stretch off Table  - 2-3 x daily - 7 x weekly - 30-60sec hold Heat to superior glute max/glute med 10-80mins 1-2x/day Self massage with ball against wall to superior glute/glute med trigger points            Objective measurements completed on examination: See above findings.                PT Education - 07/24/21 0915     Education Details Patient was educated on diagnosis, anatomy and pathology involved, prognosis, role of PT, and was given an HEP, demonstrating exercise with proper form following verbal and tactile cues, and was given a paper hand out to continue exercise at home. Pt was educated on and agreed to plan of care.    Person(s) Educated Patient    Methods Explanation;Demonstration;Verbal cues;Tactile cues    Comprehension Verbalized understanding;Returned demonstration;Verbal cues required                 PT Long Term Goals - 07/25/21 1113       PT LONG TERM GOAL #1   Title Patient will increase FOTO score to 69 to demonstrate predicted increase in functional mobility  to complete ADLs    Baseline 07/24/21 54    Time 8    Period Weeks    Status New    Target Date 09/19/21      PT LONG TERM GOAL #2   Title Pt will demonstrate L SLS of 17sec or more to demonstrate symmetrical static balance of non-affected side to return to optimal PLOF    Baseline 07/25/21 R 17sec; L 6sec    Time 8    Period Weeks    Status New    Target Date 09/19/21      PT LONG TERM GOAL #3   Title Pt will demosntrate 5xSTS test of 10sec or less to demonstrate age-matched strength norms needed for heavy household ADLs    Baseline 07/24/21 11.5sec    Time 8    Period Weeks    Target Date 09/19/21      PT LONG TERM GOAL #4   Title Pt will decrease worst hip pain as reported on NPRS by at least 2 points in order to demonstrate clinically significant reduction in back pain.    Baseline 07/24/21 5/10    Time 8    Period Weeks    Status New    Target Date 09/19/21                    Plan - 07/25/21 1038     Clinical Impression Statement Pt is a 66 year old female presenting with L hip pain. Signs and symptoms of greater trochanteric bursitis and tenditonisits of proximal ITB. Impairments in lateral hip pain, trigger points and tension to superior glute max/glute med, postural abnormalities, gait abnormalities, decreased static balance, decreased L hip strength, and motor control. Activity limitations in prolonged sitting, standing, walking, and squatting/lifting; inhibiting participation in community ADLs. Pt would benefit from skilled PT to address aforementioned impairments to best return to optimal PLOF.    Personal Factors and Comorbidities Age;Comorbidity 2;Fitness;Past/Current Experience;Time since onset of injury/illness/exacerbation    Comorbidities arthritis, anxiety, GERD    Examination-Activity Limitations Sit;Transfers;Lift;Squat;Stand;Locomotion Level    Examination-Participation Restrictions Community Activity;Occupation;Cleaning;Yard Work     Conservation officer, historic buildings Evolving/Moderate complexity    Clinical Decision Making Moderate    Rehab  Potential Good    PT Frequency 2x / week    PT Duration 8 weeks    PT Treatment/Interventions Moist Heat;DME Instruction;Therapeutic activities;ADLs/Self Care Home Management;Cryotherapy;Traction;Gait training;Therapeutic exercise;Patient/family education;Manual techniques;Spinal Manipulations;Joint Manipulations;Dry needling    PT Next Visit Plan TDN/manual to superior glute max/med; strengthening of hip ER/abd/ext    PT Home Exercise Plan glute med stretch sidelying, seated piriformis stretch, heat and massage to superior glute max and glute med    Consulted and Agree with Plan of Care Patient             Patient will benefit from skilled therapeutic intervention in order to improve the following deficits and impairments:  Decreased activity tolerance, Decreased strength, Increased fascial restricitons, Impaired flexibility, Postural dysfunction, Pain, Improper body mechanics, Impaired tone, Decreased range of motion, Abnormal gait, Decreased balance, Decreased endurance, Decreased mobility, Difficulty walking  Visit Diagnosis: Pain in left hip     Problem List Patient Active Problem List   Diagnosis Date Noted   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, [place], [address] Phone: [phone]   Fax:  [phone]  Name: Sandy Bradshaw MRN: [id number] Date of Birth: 1955-08-11

## 2021-07-28 ENCOUNTER — Ambulatory Visit: Payer: Medicare PPO

## 2021-07-28 ENCOUNTER — Encounter: Payer: Self-pay | Admitting: Physical Therapy

## 2021-07-28 DIAGNOSIS — M25552 Pain in left hip: Secondary | ICD-10-CM | POA: Diagnosis not present

## 2021-07-28 NOTE — Therapy (Signed)
[place] [facility] [address] [place], [address] Phone: [phone]   Fax:  [phone]  Physical Therapy Treatment  Patient Details  Name: Sandy Bradshaw MRN: [id number] Date of Birth: 1955/04/21 No data recorded  Encounter Date: 07/28/2021   PT End of Session - 07/28/21 0833     Visit Number 2    Number of Visits 17    Date for PT Re-Evaluation 09/19/21    Authorization - Visit Number 2    Authorization - Number of Visits 10    Progress Note Due on Visit 10    PT Start Time 0829    PT Stop Time 0911    PT Time Calculation (min) 42 min    Activity Tolerance Patient tolerated treatment well    Behavior During Therapy [facility] for tasks assessed/performed             Past Medical History:  Diagnosis Date   Anxiety    Arthritis    hands   Complication of anesthesia    wakes up shaking   GERD (gastroesophageal reflux disease)    Mastodynia of left breast     Past Surgical History:  Procedure Laterality Date   BREAST IMPLANT REMOVAL Left 04/23/2017   Procedure: REMOVAL LEFT  BREAST IMPLANT--Implant discarded.;  Surgeon: Luretha Murphy, MD;  Location: [facility];  Service: General;  Laterality: Left;   FOOT SURGERY Right    HERNIA REPAIR Right    PLACEMENT OF BREAST IMPLANTS Left 1998   Dr Shon Hough   TUBAL LIGATION  1985    There were no vitals filed for this visit.   Subjective Assessment - 07/28/21 0832     Subjective Pt reports L hip pain remains the same as eval. Has had some temporary relief from HEP and after eval. Endorses HEP compliance. Reports pain at 4/10 NPS in L hip currently.    Currently in Pain? Yes    Pain Score 4     Pain Location Hip    Pain Orientation Lateral;Left             There.ex:   Nu-Step L1 for 5 min for warm up.   Review HEP:    Access Code: [email or web address] - Seated Figure 4 Piriformis Stretch  - 2-3 x daily - 7 x weekly - 30-60sec hold -  Sidelying glute min/med Stretch off Table  - 2-3 x daily - 7 x weekly - 30-60sec hold   Verbal review of self massage and heat during seated stretches as above  Heat to superior glute max/glute med 10-20mins 1-2x/day Self massage with ball against wall to superior glute/glute med trigger points   Prone hip extension: R/L 3x8. Initial TC's to prevent rotation at hip for compensation. Good carryover.   Hook lying resisted hip ER: Blue TB, 3x8. Initial TC's for form/technique. Excellent carryover.   Standing hip abduction, no resistance: 3x8/LE. Increased L lateral lean and speed of movement with RLE due to L hip weakness in stance phase. Requiring consistent mod VC's for upright posture.   Standing hip flexion: 3x8 bilat with BUE support. VC's for speed of movement. Good carryover  Side steps: 4x10 meters, SBA  Backwards walking: 4x10 meters, SBA     PT Education - 07/28/21 0828     Education Details form/technique with exercise    Person(s) Educated Patient    Methods Explanation;Demonstration;Tactile cues;Verbal cues    Comprehension Verbalized understanding;Returned demonstration  PT Long Term Goals - 07/25/21 1113       PT LONG TERM GOAL #1   Title Patient will increase FOTO score to 69 to demonstrate predicted increase in functional mobility to complete ADLs    Baseline 07/24/21 54    Time 8    Period Weeks    Status New    Target Date 09/19/21      PT LONG TERM GOAL #2   Title Pt will demonstrate L SLS of 17sec or more to demonstrate symmetrical static balance of non-affected side to return to optimal PLOF    Baseline 07/25/21 R 17sec; L 6sec    Time 8    Period Weeks    Status New    Target Date 09/19/21      PT LONG TERM GOAL #3   Title Pt will demosntrate 5xSTS test of 10sec or less to demonstrate age-matched strength norms needed for heavy household ADLs    Baseline 07/24/21 11.5sec    Time 8    Period Weeks    Target Date 09/19/21       PT LONG TERM GOAL #4   Title Pt will decrease worst hip pain as reported on NPRS by at least 2 points in order to demonstrate clinically significant reduction in back pain.    Baseline 07/24/21 5/10    Time 8    Period Weeks    Status New    Target Date 09/19/21              Plan - 07/28/21 0858     Clinical Impression Statement Pt demonstrating good understanding of HEP with compliance. Focus of session on strenghthening hip ER, abduction and extension. Pt demonstrates noticebale weakness in L hip extension and abduction in WB and gravity dependent positions requiring VC's throughout for correct form. Pt educated on DOMS and expectations over the next day to two days post exercise. Pt will continue to benefit from skilled PT services to decrease pain and improve strength to return to PLOF.    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 Fairly IV, PT, DPT Physical Therapist- [place]  [facility]  07/28/2021, 9:12 AM  [place] [facility] SPORTS MEDICINE [address] [place], [address] Phone: [phone]   Fax:  [phone]  Name: Sandy Bradshaw MRN: [id number] Date of Birth: Jul 18, 1955

## 2021-07-31 ENCOUNTER — Encounter: Payer: Self-pay | Admitting: Physical Therapy

## 2021-07-31 ENCOUNTER — Ambulatory Visit: Payer: Medicare PPO

## 2021-07-31 DIAGNOSIS — M25552 Pain in left hip: Secondary | ICD-10-CM | POA: Diagnosis not present

## 2021-07-31 NOTE — Therapy (Signed)
[place] [facility] [address] [place], [address] Phone: [phone]   Fax:  [phone]  Physical Therapy Treatment  Patient Details  Name: Sandy Bradshaw MRN: [id number] Date of Birth: 1956-01-11 No data recorded  Encounter Date: 07/31/2021   PT End of Session - 07/31/21 1518     Visit Number 3    Number of Visits 17    Date for PT Re-Evaluation 09/19/21    Authorization - Visit Number 3    Authorization - Number of Visits 10    Progress Note Due on Visit 10    PT Start Time 1515    PT Stop Time 1559    PT Time Calculation (min) 44 min    Activity Tolerance Patient tolerated treatment well    Behavior During Therapy [facility] for tasks assessed/performed             Past Medical History:  Diagnosis Date   Anxiety    Arthritis    hands   Complication of anesthesia    wakes up shaking   GERD (gastroesophageal reflux disease)    Mastodynia of left breast     Past Surgical History:  Procedure Laterality Date   BREAST IMPLANT REMOVAL Left 04/23/2017   Procedure: REMOVAL LEFT  BREAST IMPLANT--Implant discarded.;  Surgeon: Luretha Murphy, MD;  Location: [facility];  Service: General;  Laterality: Left;   FOOT SURGERY Right    HERNIA REPAIR Right    PLACEMENT OF BREAST IMPLANTS Left 1998   Dr Shon Hough   TUBAL LIGATION  1985    There were no vitals filed for this visit.   Subjective Assessment - 07/31/21 1517     Subjective Pt reports soreness for 2 days after last session but manageable. Minor pain 3/10 reported stating pain is improving.    Currently in Pain? Yes    Pain Score 3     Pain Location Hip    Pain Orientation Left;Lateral    Pain Descriptors / Indicators Aching            There.ex:              Nu-Step L4 for 5 min for warm up.                 Prone hip extension: R/L 2x8. Initial TC's to prevent rotation at hip for compensation. Good carryover.    Prone  hip extension with knee flexed to 90 degrees: 2x8/LE. VC's for decreasing speed of exercise. Difficulty maintaining knee flexed at 90 deg throughout.   Hook lying resisted hip ER: Blue TB, 3x12. Initial TC's for form/technique. Excellent carryover.     Side lying hip abduction: VC's for set up. 2x10. Decreased motor control and reports increased difficulty with LLE requiring mod VC's for maintaining hip anteriorly rotated for improved glut med activation.  Standing hip abduction, no resistance: 2x10/LE. Increased L lateral lean and speed of movement with RLE due to L hip weakness in stance phase. Requiring consistent mod VC's for upright posture.     SLS with contralateral UE support on LLE: 3x30 sec/LE. VC's and TC's for upright posture with LLE in stance phase.    STS from standard height chair with RTB around distal femurs for hip ER/abd engagement: 3x8. VC's for eccentric control with descent.     Resisted Side steps with RTB at distal femurs : 4x20', SBA   Stretching cool down:    Figure 4  piriformis stretch seated: 2x30 sec/bilat   Lumbar trunk rotations: x10/direction   SKTC with hip adduction: 2x30 sec/LE   PT Education - 07/31/21 1518     Education Details form/technique with exercise    Person(s) Educated Patient    Methods Explanation;Demonstration;Tactile cues;Verbal cues    Comprehension Verbalized understanding;Returned demonstration                 PT Long Term Goals - 07/25/21 1113       PT LONG TERM GOAL #1   Title Patient will increase FOTO score to 69 to demonstrate predicted increase in functional mobility to complete ADLs    Baseline 07/24/21 54    Time 8    Period Weeks    Status New    Target Date 09/19/21      PT LONG TERM GOAL #2   Title Pt will demonstrate L SLS of 17sec or more to demonstrate symmetrical static balance of non-affected side to return to optimal PLOF    Baseline 07/25/21 R 17sec; L 6sec    Time 8    Period Weeks    Status New     Target Date 09/19/21      PT LONG TERM GOAL #3   Title Pt will demosntrate 5xSTS test of 10sec or less to demonstrate age-matched strength norms needed for heavy household ADLs    Baseline 07/24/21 11.5sec    Time 8    Period Weeks    Target Date 09/19/21      PT LONG TERM GOAL #4   Title Pt will decrease worst hip pain as reported on NPRS by at least 2 points in order to demonstrate clinically significant reduction in back pain.    Baseline 07/24/21 5/10    Time 8    Period Weeks    Status New    Target Date 09/19/21                   Plan - 07/31/21 1532     Clinical Impression Statement Continuing PT POC with progressive hip strengthening. Pt with excellent motivaiton tolerating increased volume and tolerance to exercise. Pt demonstrating weak glut meds against gravity as evidenced with poor motor control and hip compensation and reliance of SUE support with SLS. Overall pt reporting improvement in hip symptoms since beginning PT. Will continue to benefit from skilled PT services to progress mobility/strength and decrease pain to return to PLOF.    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 Fairly IV, PT, DPT Physical Therapist- [place]  [facility]  07/31/2021, 3:55 PM  [facility] SP[facility]TS [facility]IC[facility] [address], [place], [address] Phone: [phone]   Fax:  [phone]  Name: LAKESHIA DOHNER MRN: [id number] Date of Birth: 11/22/1955

## 2021-08-19 ENCOUNTER — Encounter: Payer: Medicare PPO | Admitting: Physical Therapy

## 2021-08-21 ENCOUNTER — Ambulatory Visit: Payer: Medicare PPO | Attending: Physician Assistant | Admitting: Physical Therapy

## 2021-08-21 ENCOUNTER — Encounter: Payer: Self-pay | Admitting: Physical Therapy

## 2021-08-21 DIAGNOSIS — M25552 Pain in left hip: Secondary | ICD-10-CM | POA: Insufficient documentation

## 2021-08-21 NOTE — Therapy (Signed)
[facility] [address] [place], [address] Phone: [phone]   Fax:  [phone]  Physical Therapy Treatment  Patient Details  Name: Sandy Bradshaw MRN: [id number] Date of Birth: May 29, 1955 No data recorded  Encounter Date: 08/21/2021   PT End of Session - 08/21/21 1406     Visit Number 4    Number of Visits 17    Date for PT Re-Evaluation 09/19/21    Authorization - Visit Number 4    Authorization - Number of Visits 10    Progress Note Due on Visit 10    PT Start Time 1345    PT Stop Time 1425    PT Time Calculation (min) 40 min    Activity Tolerance Patient tolerated treatment well    Behavior During Therapy [facility] for tasks assessed/performed             Past Medical History:  Diagnosis Date   Anxiety    Arthritis    hands   Complication of anesthesia    wakes up shaking   GERD (gastroesophageal reflux disease)    Mastodynia of left breast     Past Surgical History:  Procedure Laterality Date   BREAST IMPLANT REMOVAL Left 04/23/2017   Procedure: REMOVAL LEFT  BREAST IMPLANT--Implant discarded.;  Surgeon: Luretha Murphy, MD;  Location: [facility];  Service: General;  Laterality: Left;   FOOT SURGERY Right    HERNIA REPAIR Right    PLACEMENT OF BREAST IMPLANTS Left 1998   Dr Shon Hough   TUBAL LIGATION  1985    There were no vitals filed for this visit.   Subjective Assessment - 08/21/21 1351     Subjective Pt reports she is feeling a little better, reports 4/10 pain. Reports compliance with her HEP.    Pertinent History Pt is a 66 year old female presenting with L hip pain that began insideously "a couple months ago". She had the same pain in 2019 as well. Had a predinsone injection about a month ago that improved pain but is still having pain at (points to) proximal ITB. Pain is dull and nagging in nature, worst pain in past week 5/10; best 0/10; current 2/10. No  radiating pain or n/t.  Pain is Aggravating factors walking >5-62mins on concrete, standing >67mins, sitting with pressure on L side. Relieving factors advil (but upsets her tummy), temporary relief from icy-hot. 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 Pt lives with her husband, and enjoys sewing.  Pt denies N/V, B&B changes, unexplained weight fluctuation, saddle paresthesia, fever, night sweats, or unrelenting night pain at this time.    Limitations Lifting;Walking;Sitting;Standing;House hold activities    How long can you sit comfortably? limited in office chair to 10-76mins    How long can you stand comfortably?    How long can you walk comfortably? 5-81mins on hard floors    Diagnostic tests XRAY negative    Patient Stated Goals go back to all my activities without pain    Pain Onset More than a month ago              There.ex:              Nu-Step L4 for 5 min for warm up.     Qped birddog 2x  12 with good carry over of initial cuing for set up  Standing hip hike (L) 2x 10 with good carry over of demo   L split squat with 10# in RUE 2x 10 with demo and max cuing initially for split squat>lunge with good carry over  Hip hike with CLLE wing x12 bilat  Sitting glute stretch 30sec Sitting piriformis stretch 30sec Half moon TFL stretch at doorway 30sec   Manual STM with trigger point release to glute med, superior glute fibers. Cross friction massage to proximal ITB                       PT Education - 08/21/21 1355     Education Details therex form/technique    Person(s) Educated Patient    Methods Explanation;Demonstration;Verbal cues    Comprehension Verbalized understanding;Returned demonstration;Verbal cues required                 PT Long Term Goals - 07/25/21 1113       PT LONG TERM GOAL #1    Title Patient will increase FOTO score to 69 to demonstrate predicted increase in functional mobility to complete ADLs    Baseline 07/24/21 54    Time 8    Period Weeks    Status New    Target Date 09/19/21      PT LONG TERM GOAL #2   Title Pt will demonstrate L SLS of 17sec or more to demonstrate symmetrical static balance of non-affected side to return to optimal PLOF    Baseline 07/25/21 R 17sec; L 6sec    Time 8    Period Weeks    Status New    Target Date 09/19/21      PT LONG TERM GOAL #3   Title Pt will demosntrate 5xSTS test of 10sec or less to demonstrate age-matched strength norms needed for heavy household ADLs    Baseline 07/24/21 11.5sec    Time 8    Period Weeks    Target Date 09/19/21      PT LONG TERM GOAL #4   Title Pt will decrease worst hip pain as reported on NPRS by at least 2 points in order to demonstrate clinically significant reduction in back pain.    Baseline 07/24/21 5/10    Time 8    Period Weeks    Status New    Target Date 09/19/21                   Plan - 08/21/21 1415     Clinical Impression Statement PT utilized manual techniques to relieve glute med/max tension to decrease ITB with success. Painful trigger points noted along glute med/superior glute max with continued pinpoint tenderness at greater trochanter. PT continue dtherex progression for progressive loading/strengthening of glute complex with success. Patient is able to comply with all cuing for proper technique of therex with good motivaiton and no increased pain throughout session. PT will continue progression as able.    Personal Factors and Comorbidities Age;Comorbidity 2;Fitness;Past/Current Experience;Time since onset of injury/illness/exacerbation    Comorbidities arthritis, anxiety, GERD    Examination-Activity Limitations Sit;Transfers;Lift;Squat;Stand;Locomotion Level    Examination-Participation Restrictions Community Activity;Occupation;Cleaning;Yard Work     Conservation officer, historic buildings Evolving/Moderate complexity    Clinical Decision Making Moderate    Rehab Potential Good    PT Frequency 2x / week    PT Duration 8 weeks    PT Treatment/Interventions Moist Heat;DME Instruction;Therapeutic activities;ADLs/Self Care Home Management;Cryotherapy;Traction;Gait training;Therapeutic exercise;Patient/family  education;Manual techniques;Spinal Manipulations;Joint Manipulations;Dry needling    PT Next Visit Plan TDN/manual to superior glute max/med; strengthening of hip ER/abd/ext    PT Home Exercise Plan glute med stretch sidelying, seated piriformis stretch, heat and massage to superior glute max and glute med    Consulted and Agree with Plan of Care Patient             Patient will benefit from skilled therapeutic intervention in order to improve the following deficits and impairments:  Decreased activity tolerance, Decreased strength, Increased fascial restricitons, Impaired flexibility, Postural dysfunction, Pain, Improper body mechanics, Impaired tone, Decreased range of motion, Abnormal gait, Decreased balance, Decreased endurance, Decreased mobility, Difficulty walking  Visit Diagnosis: Pain in left hip     Problem List Patient Active Problem List   Diagnosis Date Noted   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 [place], [address] Phone: [phone]   Fax:  [phone]  Name: Sandy Bradshaw MRN: [id number] Date of Birth: 21-Dec-1955

## 2021-08-26 ENCOUNTER — Ambulatory Visit: Payer: Medicare PPO | Admitting: Physical Therapy

## 2021-08-26 ENCOUNTER — Encounter: Payer: Self-pay | Admitting: Physical Therapy

## 2021-08-26 DIAGNOSIS — M25552 Pain in left hip: Secondary | ICD-10-CM

## 2021-08-26 NOTE — Therapy (Signed)
OUTPATIENT PHYSICAL THERAPY TREATMENT NOTE   Patient Name: Sandy Bradshaw MRN: [id number] DOB:[birth year], 66 y.o., female Today's Date: 08/26/2021  PCP: No PCP per patient REFERRING PROVIDER: Kirtland Bouchard PA-C   PT End of Session - 08/26/21 1300     Visit Number 5    Number of Visits 17    Date for PT Re-Evaluation 09/19/21    Authorization - Visit Number 5    Authorization - Number of Visits 10    Progress Note Due on Visit 10    PT Start Time 1301    PT Stop Time 1340    PT Time Calculation (min) 39 min    Activity Tolerance Patient tolerated treatment well    Behavior During Therapy WFL for tasks assessed/performed             Past Medical History:  Diagnosis Date   Anxiety    Arthritis    hands   Complication of anesthesia    wakes up shaking   GERD (gastroesophageal reflux disease)    Mastodynia of left breast    Past Surgical History:  Procedure Laterality Date   BREAST IMPLANT REMOVAL Left 04/23/2017   Procedure: REMOVAL LEFT  BREAST IMPLANT--Implant discarded.;  Surgeon: Luretha Murphy, MD;  Location: [facility];  Service: General;  Laterality: Left;   FOOT SURGERY Right    HERNIA REPAIR Right    PLACEMENT OF BREAST IMPLANTS Left 1998   Dr Shon Hough   TUBAL LIGATION  1985   Patient Active Problem List   Diagnosis Date Noted   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 She had the same pain in 2019 as well. Had a predinsone injection about a month ago that improved pain but is still having pain at (points to) proximal ITB. Pain is dull and nagging in nature, worst pain in past week 5/10; best 0/10; current 2/10. No  radiating pain or n/t.  Pain is Aggravating factors walking >5-70mins on concrete, standing >51mins, sitting with pressure on L side. Relieving factors advil (but upsets her tummy), temporary relief from icy-hot. 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 Pt lives with her husband, and enjoys sewing.  Pt denies N/V, B&B changes, unexplained weight fluctuation, saddle paresthesia, fever, night sweats, or unrelenting night pain at this time.   PRECAUTIONS: none  SUBJECTIVE: Reports having some increased pain today that worsened Sunday. She thinks she is having more pain following doing less activity than usual. Min compliance with HEP over the weekend. Her pain is more prevalent when she wakes in the morning  PAIN:  Are you having pain? Yes 5/10 at L lower back/hip     TODAY'S TREATMENT:  There.ex:              Nu-Step L4 for 5 min for warm up.     Prone L hip ext with knee flex 2x 10 with min cuing for glute activaiton with good carry over  Single leg bridge (L) 2x 8 with good carry over following initial demo  Single leg hip hinge 2x 6 with excellent carry over of demo   Standing hip hike (L) 2x 10 off 2in step    Figure 4 stretch 30sec Sitting glute stretch 30sec Thomas stretch 30sec      Manual STM with trigger point release to glute med, superior glute fibers. Cross friction massage to proximal ITB   PATIENT EDUCATION: Education details: therex form/technique Person educated: Patient Education method: Solicitor, and Verbal cues Education comprehension: verbalized understanding, returned demonstration, and verbal cues required   HOME EXERCISE PROGRAM: glute med stretch sidelying, seated piriformis stretch, heat and massage to superior glute max and glute med   Clinical Impression: PT continued therex progression for  increased hip stability/strengthening and decreasing muscle tension of superior glute max/glute med (L) with success. Patient reports no pain at the end of session (initially 5/10). Patient is able to comply with all cuing for proper technique of therex with good motivation throughout session. Pt with palpable decreased tension following manual techniques. PT will continue progression as able.      PT Long Term Goals - 07/25/21 1113       PT LONG TERM GOAL #1   Title Patient will increase FOTO score to 69 to demonstrate predicted increase in functional mobility to complete ADLs    Baseline 07/24/21 54    Time 8    Period Weeks    Status New    Target Date 09/19/21      PT LONG TERM GOAL #2   Title Pt will demonstrate L SLS of 17sec or more to demonstrate symmetrical static balance of non-affected side to return to optimal PLOF    Baseline 07/25/21 R 17sec; L 6sec    Time 8    Period Weeks    Status New    Target Date 09/19/21      PT LONG TERM GOAL #3   Title Pt will demosntrate 5xSTS test of 10sec or less to demonstrate age-matched strength norms needed for heavy household ADLs    Baseline 07/24/21 11.5sec    Time 8    Period Weeks    Target Date 09/19/21      PT LONG TERM GOAL #4   Title Pt will decrease worst hip pain as reported on NPRS by at least 2 points in order to demonstrate clinically significant reduction in back pain.    Baseline 07/24/21 5/10    Time 8    Period Weeks    Status New    Target Date 09/19/21                Hilda Lias DPT Hilda Lias, PT 08/26/2021, 1:44 PM

## 2021-08-28 ENCOUNTER — Ambulatory Visit: Payer: Medicare PPO | Admitting: Physical Therapy

## 2021-08-29 ENCOUNTER — Ambulatory Visit: Payer: Medicare PPO | Admitting: Physical Therapy

## 2021-08-29 ENCOUNTER — Encounter: Payer: Self-pay | Admitting: Physical Therapy

## 2021-08-29 DIAGNOSIS — M25552 Pain in left hip: Secondary | ICD-10-CM | POA: Diagnosis not present

## 2021-08-29 NOTE — Therapy (Signed)
OUTPATIENT PHYSICAL THERAPY TREATMENT NOTE   Patient Name: Sandy Bradshaw MRN: [id number] DOB:1955-08-02, 66 y.o., female Today's Date: 08/29/2021  PCP: No PCP per patient REFERRING PROVIDER: Kirtland Bouchard PA-C   PT End of Session - 08/29/21 0752     Visit Number 6    Number of Visits 17    Date for PT Re-Evaluation 09/19/21    Authorization - Visit Number 6    Authorization - Number of Visits 10    Progress Note Due on Visit 10    PT Start Time 0748    PT Stop Time 0826    PT Time Calculation (min) 38 min    Activity Tolerance Patient tolerated treatment well    Behavior During Therapy WFL for tasks assessed/performed              Past Medical History:  Diagnosis Date   Anxiety    Arthritis    hands   Complication of anesthesia    wakes up shaking   GERD (gastroesophageal reflux disease)    Mastodynia of left breast    Past Surgical History:  Procedure Laterality Date   BREAST IMPLANT REMOVAL Left 04/23/2017   Procedure: REMOVAL LEFT  BREAST IMPLANT--Implant discarded.;  Surgeon: Luretha Murphy, MD;  Location: [facility];  Service: General;  Laterality: Left;   FOOT SURGERY Right    HERNIA REPAIR Right    PLACEMENT OF BREAST IMPLANTS Left 1998   Dr Shon Hough   TUBAL LIGATION  1985   Patient Active Problem List   Diagnosis Date Noted   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 She had the same pain in 2019 as well. Had a predinsone injection about a month ago that improved pain but is still having pain at (points to) proximal ITB. Pain is dull and nagging in nature, worst pain in past week 5/10; best 0/10; current 2/10. No  radiating pain or n/t.  Pain is Aggravating factors walking >5-50mins on concrete, standing >74mins, sitting with pressure on L side. Relieving factors advil (but upsets her tummy), temporary relief from icy-hot. 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 Pt lives with her husband, and enjoys sewing.  Pt denies N/V, B&B changes, unexplained weight fluctuation, saddle paresthesia, fever, night sweats, or unrelenting night pain at this time.   PRECAUTIONS: none  SUBJECTIVE: Reports she was sore after last session, no increased pain. Pt reports compliance with HEP   PAIN:  Are you having pain? Yes 5/10 at L lower back/hip     TODAY'S TREATMENT:  There.ex:  Nu-Step UE 12 seat 8 L4 for 5 min for gentle motion and strengthening    Prone L hip ext with knee flex 2x 10 with min cuing for glute activaiton with good carry over  Single leg bridge (L) x8 with hamstring activation > glute ext Bridge on bosu ball (hardside) 2x 10 with good carry over of technique  Single leg hip hinge x8; with 10# DB 2x 6 with good carry over demo of demo  Figure 4 stretch 30sec Sitting glute stretch 30sec    Manual STM with trigger point release to glute med, superior glute fibers. Cross friction massage to proximal ITB   PATIENT EDUCATION: Education details: therex form/technique Person educated: Patient Education method: Solicitor, and Verbal cues Education comprehension: verbalized understanding, returned demonstration, and verbal cues required   HOME EXERCISE PROGRAM: glute med stretch sidelying, seated piriformis stretch, heat and massage to superior glute max and glute med   Clinical Impression: PT continued therex progression for increased hip stability/strengthening and decreasing muscle tension of superior glute max/glute med (L) with success.  Patient is able to comply with all cuing for proper technique of therex with good motivation throughout session. PT reports no increased pain throughout session . PT encouraged to continue HEP stretching to aid in decreasing post exercise soreness. Pt with palpable decreased tension following manual techniques. PT will continue progression as able.      PT Long Term Goals - 07/25/21 1113       PT LONG TERM GOAL #1   Title Patient will increase FOTO score to 69 to demonstrate predicted increase in functional mobility to complete ADLs    Baseline 07/24/21 54    Time 8    Period Weeks    Status New    Target Date 09/19/21      PT LONG TERM GOAL #2   Title Pt will demonstrate L SLS of 17sec or more to demonstrate symmetrical static balance of non-affected side to return to optimal PLOF    Baseline 07/25/21 R 17sec; L 6sec    Time 8    Period Weeks    Status New    Target Date 09/19/21      PT LONG TERM GOAL #3   Title Pt will demosntrate 5xSTS test of 10sec or less to demonstrate age-matched strength norms needed for heavy household ADLs    Baseline 07/24/21 11.5sec    Time 8    Period Weeks    Target Date 09/19/21      PT LONG TERM GOAL #4   Title Pt will decrease worst hip pain as reported on NPRS by at least 2 points in order to demonstrate clinically significant reduction in back pain.    Baseline 07/24/21 5/10    Time 8    Period Weeks    Status New    Target Date 09/19/21                Hilda Lias DPT Hilda Lias, PT 08/29/2021, 8:31 AM

## 2021-09-01 ENCOUNTER — Encounter: Payer: Self-pay | Admitting: Physical Therapy

## 2021-09-01 ENCOUNTER — Ambulatory Visit: Payer: Medicare PPO | Admitting: Physical Therapy

## 2021-09-01 DIAGNOSIS — M25552 Pain in left hip: Secondary | ICD-10-CM

## 2021-09-01 NOTE — Therapy (Signed)
OUTPATIENT PHYSICAL THERAPY TREATMENT NOTE   Patient Name: Sandy Bradshaw MRN: [id number] DOB:02/24/1955, 66 y.o., female Today's Date: 09/01/2021  PCP: No PCP per patient REFERRING PROVIDER: Kirtland Bouchard PA-C   PT End of Session - 09/01/21 1314     Visit Number 7    Number of Visits 17    Date for PT Re-Evaluation 09/19/21    Authorization - Visit Number 7    Authorization - Number of Visits 10    Progress Note Due on Visit 10    PT Start Time 1308    PT Stop Time 1346    PT Time Calculation (min) 38 min    Activity Tolerance Patient tolerated treatment well    Behavior During Therapy WFL for tasks assessed/performed               Past Medical History:  Diagnosis Date   Anxiety    Arthritis    hands   Complication of anesthesia    wakes up shaking   GERD (gastroesophageal reflux disease)    Mastodynia of left breast    Past Surgical History:  Procedure Laterality Date   BREAST IMPLANT REMOVAL Left 04/23/2017   Procedure: REMOVAL LEFT  BREAST IMPLANT--Implant discarded.;  Surgeon: Luretha Murphy, MD;  Location: [facility];  Service: General;  Laterality: Left;   FOOT SURGERY Right    HERNIA REPAIR Right    PLACEMENT OF BREAST IMPLANTS Left 1998   Dr Shon Hough   TUBAL LIGATION  1985   Patient Active Problem List   Diagnosis Date Noted   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 She had the same pain in 2019 as well. Had a predinsone injection about a month ago that improved pain but is still having pain at (points to) proximal ITB. Pain is dull and nagging in nature, worst pain in past week 5/10; best 0/10; current 2/10. No  radiating pain or n/t.  Pain is Aggravating factors walking >5-39mins on concrete, standing >70mins, sitting with pressure on L side. Relieving factors advil (but upsets her tummy), temporary relief from icy-hot. 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 Pt lives with her husband, and enjoys sewing.  Pt denies N/V, B&B changes, unexplained weight fluctuation, saddle paresthesia, fever, night sweats, or unrelenting night pain at this time.   PRECAUTIONS: none  SUBJECTIVE: Patient reports compliance with HEP. Reports minimal soreness today, no pain   PAIN:  Are you having pain? Yes 0/10 with soreness at L lateral hip      TODAY'S TREATMENT:  There.ex:  Nu-Step UE 12 seat 8 L4 for 5 min for gentle motion and strengthening    Single leg bridge (L) 3x 8 with glute set prior, with patient reporting no hamstring cramping following this  Qped alt hip ext x16 (8 each LE) with good carry over of initial demo; full birddog alt 8x each with cuing for maintaining core control with good carry over   L elevated split squat 2x 10 (12in step with good carry over  following demo   Hip hike 2x 12 with good carry over of demo   Figure 4 stretch 30sec Sitting glute stretch 30sec    PATIENT EDUCATION: Education details: therex form/technique Person educated: Patient Education method: Programmer, multimedia, Demonstration, and Verbal cues Education comprehension: verbalized understanding, returned demonstration, and verbal cues required   HOME EXERCISE PROGRAM: glute med stretch sidelying, seated piriformis stretch, heat and massage to superior glute max and glute med   Clinical Impression: PT continued therex progression for increased hip stability/strengthening and lumbopelvic dissociation. Patient is able to comply with all cuing for proper technique of therex with good  motivation throughout session, without increased pain. PT will continue progression as able.      PT Long Term Goals - 07/25/21 1113       PT LONG TERM GOAL #1   Title Patient will increase FOTO score to 69 to demonstrate predicted increase in functional mobility to complete ADLs    Baseline 07/24/21 54    Time 8    Period Weeks    Status New    Target Date 09/19/21      PT LONG TERM GOAL #2   Title Pt will demonstrate L SLS of 17sec or more to demonstrate symmetrical static balance of non-affected side to return to optimal PLOF    Baseline 07/25/21 R 17sec; L 6sec    Time 8    Period Weeks    Status New    Target Date 09/19/21      PT LONG TERM GOAL #3   Title Pt will demosntrate 5xSTS test of 10sec or less to demonstrate age-matched strength norms needed for heavy household ADLs    Baseline 07/24/21 11.5sec    Time 8    Period Weeks    Target Date 09/19/21      PT LONG TERM GOAL #4   Title Pt will decrease worst hip pain as reported on NPRS by at least 2 points in order to demonstrate clinically significant reduction in back pain.    Baseline 07/24/21 5/10    Time 8    Period Weeks    Status New    Target Date 09/19/21                Hilda Lias DPT Hilda Lias, PT 09/01/2021, 3:32 PM

## 2021-09-03 ENCOUNTER — Ambulatory Visit: Payer: Medicare PPO | Admitting: Physical Therapy

## 2021-09-03 ENCOUNTER — Encounter: Payer: Self-pay | Admitting: Physical Therapy

## 2021-09-03 DIAGNOSIS — M25552 Pain in left hip: Secondary | ICD-10-CM | POA: Diagnosis not present

## 2021-09-03 NOTE — Therapy (Signed)
OUTPATIENT PHYSICAL THERAPY TREATMENT NOTE   Patient Name: Sandy Bradshaw MRN: [id number] DOB:Jan 17, 1956, 66 y.o., female Today's Date: 09/03/2021  PCP: No PCP per patient REFERRING PROVIDER: Kirtland Bouchard PA-C   PT End of Session - 09/03/21 1308     Visit Number 8    Number of Visits 17    Date for PT Re-Evaluation 09/19/21    Authorization - Visit Number 8    Authorization - Number of Visits 10    Progress Note Due on Visit 10    PT Start Time 1304    PT Stop Time 1344    PT Time Calculation (min) 40 min    Activity Tolerance Patient tolerated treatment well    Behavior During Therapy WFL for tasks assessed/performed                Past Medical History:  Diagnosis Date   Anxiety    Arthritis    hands   Complication of anesthesia    wakes up shaking   GERD (gastroesophageal reflux disease)    Mastodynia of left breast    Past Surgical History:  Procedure Laterality Date   BREAST IMPLANT REMOVAL Left 04/23/2017   Procedure: REMOVAL LEFT  BREAST IMPLANT--Implant discarded.;  Surgeon: Luretha Murphy, MD;  Location: [facility];  Service: General;  Laterality: Left;   FOOT SURGERY Right    HERNIA REPAIR Right    PLACEMENT OF BREAST IMPLANTS Left 1998   Dr Shon Hough   TUBAL LIGATION  1985   Patient Active Problem List   Diagnosis Date Noted   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 She had the same pain in 2019 as well. Had a predinsone injection about a month ago that improved pain but is still having pain at (points to) proximal ITB. Pain is dull and nagging in nature, worst pain in past week 5/10; best 0/10; current 2/10.  No radiating pain or n/t.  Pain is Aggravating factors walking >5-3mins on concrete, standing >7mins, sitting with pressure on L side. Relieving factors advil (but upsets her tummy), temporary relief from icy-hot. 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 Pt lives with her husband, and enjoys sewing.  Pt denies N/V, B&B changes, unexplained weight fluctuation, saddle paresthesia, fever, night sweats, or unrelenting night pain at this time.   PRECAUTIONS: none  SUBJECTIVE: Patient    PAIN:  Are you having pain? Yes 0/10 with soreness at L lateral hip      TODAY'S TREATMENT:  There.ex:  Nu-Step UE 12 seat 8 L4 for 5 min for gentle motion and strengthening   Single leg bridge (L) 2x 10 with good carry over of cuing, no hamstring cramping  Birddog 2x 12 with min cuing for trunk stability with good carry over  KB swings 3x 10 20# with good carry over of initial demo   Split squat on chair 2x 10 (12in step with good carry over following demo    Hip hike + CLLE swing x12 bilat Figure 4 stretch 30sec  Sitting glute stretch 30sec    PATIENT EDUCATION: Education details: therex form/technique Person educated: Patient Education method: Programmer, multimedia, Demonstration, and Verbal cues Education comprehension: verbalized understanding, returned demonstration, and verbal cues required   HOME EXERCISE PROGRAM: glute med stretch sidelying, seated piriformis stretch, heat and massage to superior glute max and glute med   Clinical Impression: PT continued therex progression for increased hip stability/strengthening and lumbopelvic dissociation. Patient is able to comply with all cuing for proper technique of therex with good motivation throughout session, without increased pain. PT will continue progression as able.      PT Long Term Goals - 07/25/21 1113        PT LONG TERM GOAL #1   Title Patient will increase FOTO score to 69 to demonstrate predicted increase in functional mobility to complete ADLs    Baseline 07/24/21 54    Time 8    Period Weeks    Status New    Target Date 09/19/21      PT LONG TERM GOAL #2   Title Pt will demonstrate L SLS of 17sec or more to demonstrate symmetrical static balance of non-affected side to return to optimal PLOF    Baseline 07/25/21 R 17sec; L 6sec    Time 8    Period Weeks    Status New    Target Date 09/19/21      PT LONG TERM GOAL #3   Title Pt will demosntrate 5xSTS test of 10sec or less to demonstrate age-matched strength norms needed for heavy household ADLs    Baseline 07/24/21 11.5sec    Time 8    Period Weeks    Target Date 09/19/21      PT LONG TERM GOAL #4   Title Pt will decrease worst hip pain as reported on NPRS by at least 2 points in order to demonstrate clinically significant reduction in back pain.    Baseline 07/24/21 5/10    Time 8    Period Weeks    Status New    Target Date 09/19/21                Hilda Lias DPT Hilda Lias, PT 09/03/2021, 1:35 PM

## 2021-09-08 ENCOUNTER — Ambulatory Visit: Payer: Medicare PPO | Admitting: Physical Therapy

## 2021-09-11 ENCOUNTER — Encounter: Payer: Self-pay | Admitting: Physical Therapy

## 2021-09-11 ENCOUNTER — Ambulatory Visit: Payer: Medicare PPO | Attending: Physician Assistant | Admitting: Physical Therapy

## 2021-09-11 DIAGNOSIS — M25552 Pain in left hip: Secondary | ICD-10-CM | POA: Insufficient documentation

## 2021-09-11 NOTE — Therapy (Signed)
OUTPATIENT PHYSICAL THERAPY TREATMENT NOTE/DC Summary Reporting Period 07/24/21 - 09/11/21   Patient Name: Sandy Bradshaw MRN: [id number] DOB:1955/12/04, 66 y.o., female Today's Date: 09/11/2021  PCP: No PCP per patient REFERRING PROVIDER: Pete Pelt PA-C   PT End of Session - 09/11/21 1348     Visit Number 9    Number of Visits 17    Date for PT Re-Evaluation 09/19/21    Authorization - Visit Number 9    Authorization - Number of Visits 10    Progress Note Due on Visit 10    PT Start Time 0998    PT Stop Time 1430    PT Time Calculation (min) 45 min    Activity Tolerance Patient tolerated treatment well    Behavior During Therapy WFL for tasks assessed/performed                 Past Medical History:  Diagnosis Date   Anxiety    Arthritis    hands   Complication of anesthesia    wakes up shaking   GERD (gastroesophageal reflux disease)    Mastodynia of left breast    Past Surgical History:  Procedure Laterality Date   BREAST IMPLANT REMOVAL Left 04/23/2017   Procedure: REMOVAL LEFT  BREAST IMPLANT--Implant discarded.;  Surgeon: Johnathan Hausen, MD;  Location: [place] [facility];  Service: General;  Laterality: Left;   FOOT SURGERY Right    HERNIA REPAIR Right    PLACEMENT OF BREAST IMPLANTS Left 1998   Dr Towanda Malkin   TUBAL LIGATION  1985   Patient Active Problem List   Diagnosis Date Noted   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 She had the same pain in 2019 as well. Had a predinsone injection about a month ago that improved pain but is still having pain at (points to) proximal ITB. Pain is dull and nagging in nature, worst  pain in past week 5/10; best 0/10; current 2/10. No radiating pain or n/t.  Pain is Aggravating factors walking >5-86mns on concrete, standing >364ms, sitting with pressure on L side. Relieving factors advil (but upsets her tummy), temporary relief from icy-hot. 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 Pt lives with her husband, and enjoys sewing.  Pt denies N/V, B&B changes, unexplained weight fluctuation, saddle paresthesia, fever, night sweats, or unrelenting night pain at this time.   PRECAUTIONS: none  SUBJECTIVE: Patient  reports she is doing very well. Feels as though she is ready to d/c PT to robust HEP.   PAIN:  Are you having pain? Yes 0/10 with soreness at L lateral hip      TODAY'S TREATMENT:  There.ex:  Nu-Step UE 12 seat 8 L4 for 5 min for gentle motion and strengthening   PT reviewed the following HEP with patient with patient able to demonstrate a set of the following with min cuing for correction needed. PT educated patient on parameters of therex (how/when to inc/decrease intensity, frequency, rep/set range, stretch hold time, and purpose of therex) with verbalized understanding.  Access Code: [email or web address] - Single Leg Bridge  - 1 x daily - 1-2 x weekly - 3 sets - 10 reps - Kettlebell Swing  - 1 x daily - 1-2 x weekly - 3 sets - 10 reps - Single Leg Lunge with Foot on Bench  - 1 x daily - 1-2 x weekly - 3 sets - 10 reps - Squat with Resistance at Thighs  - 1 x daily - 1-2 x weekly - 3 sets - 10 reps    PATIENT EDUCATION: Education details: therex form/technique Person educated: Patient Education method: Consulting civil engineer, Demonstration, and Verbal cues Education comprehension: verbalized understanding, returned demonstration, and verbal cues required   HOME EXERCISE PROGRAM: glute med stretch sidelying, seated piriformis stretch, heat  and massage to superior glute max and glute med   Clinical Impression: PT reassessed goals this session where patient has met all goals to safely d/c to robust HEP program. Patient is able to demonstrate good carry over of HEP exercises, and is able to verbalize understanding of all recommendations. Pt given handout and clinic contact info should further questions or concerns arise.      PT Long Term Goals - 09/11/21 1349       PT LONG TERM GOAL #1   Title Patient will increase FOTO score to 69 to demonstrate predicted increase in functional mobility to complete ADLs    Baseline 07/24/21 54 09/11/21 72    Time 8    Period Weeks    Status Achieved      PT LONG TERM GOAL #2   Title Pt will demonstrate L SLS of 17sec or more to demonstrate symmetrical static balance of non-affected side to return to optimal PLOF    Baseline 07/25/21 R 17sec; L 6sec; 09/11/21 ceased 30sec    Time 8    Period Weeks    Status Achieved      PT LONG TERM GOAL #3   Title Pt will demosntrate 5xSTS test of 10sec or less to demonstrate age-matched strength norms needed for heavy household ADLs    Baseline 07/24/21 11.5sec; 09/11/21 9sec    Time 8    Period Weeks    Status Achieved    Target Date 09/19/21      PT LONG TERM GOAL #4   Title Pt will decrease worst hip pain as reported on NPRS by at least 2 points in order to demonstrate clinically significant reduction in back pain.    Baseline 07/24/21 5/10; 09/11/21 0/10 pain today    Time 8    Period Weeks    Status Achieved    Target Date 09/19/21                Durwin Reges DPT Durwin Reges, PT 09/11/2021, 2:21 PM

## 2021-09-15 ENCOUNTER — Ambulatory Visit: Payer: Medicare PPO | Admitting: Physical Therapy

## 2021-09-17 ENCOUNTER — Encounter: Payer: Medicare PPO | Admitting: Physical Therapy

## 2021-09-22 ENCOUNTER — Encounter: Payer: Medicare PPO | Admitting: Physical Therapy

## 2021-09-24 ENCOUNTER — Encounter: Payer: Medicare PPO | Admitting: Physical Therapy

## 2021-11-25 ENCOUNTER — Ambulatory Visit: Payer: Medicare PPO | Admitting: Allergy and Immunology

## 2021-11-25 ENCOUNTER — Encounter: Payer: Self-pay | Admitting: Allergy and Immunology

## 2021-11-25 ENCOUNTER — Other Ambulatory Visit: Payer: Self-pay

## 2021-11-25 VITALS — BP 118/76 | HR 64 | Temp 98.3°F | Resp 18 | Ht 66.0 in | Wt 203.4 lb

## 2021-11-25 DIAGNOSIS — B369 Superficial mycosis, unspecified: Secondary | ICD-10-CM

## 2021-11-25 DIAGNOSIS — L989 Disorder of the skin and subcutaneous tissue, unspecified: Secondary | ICD-10-CM

## 2021-11-25 DIAGNOSIS — L2089 Other atopic dermatitis: Secondary | ICD-10-CM

## 2021-11-25 MED ORDER — PIMECROLIMUS 1 % EX CREA: TOPICAL_CREAM | Freq: Two times a day (BID) | CUTANEOUS | 3 refills | Status: DC

## 2021-11-25 MED ORDER — MOMETASONE FUROATE 0.1 % EX OINT: TOPICAL_OINTMENT | Freq: Every day | CUTANEOUS | 3 refills | Status: DC

## 2021-11-25 MED ORDER — FLUCONAZOLE 150 MG PO TABS: ORAL_TABLET | ORAL | 0 refills | Status: DC

## 2021-11-25 NOTE — Patient Instructions (Addendum)
   1.  Allergen avoidance measures???  2.  Treat and prevent inflammation:   A. Elidel applied 2 times per day, followed by,  B.  Mometasone 0.1% ointment applied 2 times per day  3.  Treat and prevent fungal overgrowth:   A.  Diflucan 150 -1 tablet 1 time per week for 4 weeks  4.  Can continue antihistamine as needed  5.  Obtain fall flu vaccine and RSV vaccine  6.  Return to clinic in 4 weeks or earlier if problem

## 2021-11-25 NOTE — Progress Notes (Unsigned)
[facility]   NEW PATIENT NOTE  Referring Provider: No ref. provider found Primary Provider: Patient, No Pcp Per Date of office visit: 11/25/2021    Subjective:   Chief Complaint:  Sandy Bradshaw (DOB: 1955-05-24) is a 66 y.o. female who presents to the clinic on 11/25/2021 with a chief complaint of Rash .     HPI: Sandy Bradshaw presents to this clinic in evaluation of dermatitis.  She states that for the past 12 years after contracting shingles on her right flank that she has had a persistent dermatitis in that area that occasionally is painful and occasionally is itchy.  It never really goes away completely.  There is also some involvement of her left flank as well.  She has seen a dermatologist about this issue who has treated her with what sounds like triamcinolone cream which did not help her at all.  There is no other associated systemic or constitutional symptoms associated with this dermatitis.  There is not really an obvious provoking factor giving rise to this dermatitis.  She does not really have much atopic disease other than some occasional allergic rhinitis which appears to show up during the seasons of the year for which she will take some antihistamines.  Past Medical History:  Diagnosis Date   Anxiety    Arthritis    hands   Complication of anesthesia    wakes up shaking   GERD (gastroesophageal reflux disease)    Mastodynia of left breast     Past Surgical History:  Procedure Laterality Date   BREAST IMPLANT REMOVAL Left 04/23/2017   Procedure: REMOVAL LEFT  BREAST IMPLANT--Implant discarded.;  Surgeon: Johnathan Hausen, MD;  Location: [facility];  Service: General;  Laterality: Left;   FOOT SURGERY Right    HERNIA REPAIR Right    PLACEMENT OF BREAST IMPLANTS Left 1998   Dr [place]    Allergies as of 11/25/2021       Reactions   Erythromycin Base Shortness Of Breath    Tape Rash, Itching   Red,whelps Red,whelps   Reglan [metoclopramide] Other (See Comments)   Patient states that it makes her depressed        Medication List    ALPRAZolam 1 MG tablet Commonly known as: XANAX Take 1 mg by mouth at bedtime as needed for anxiety.   methylcellulose oral powder Take by mouth daily.   Probiotic Advanced Caps Take by mouth.    Review of systems negative except as noted in HPI / PMHx or noted below:  Review of Systems  Constitutional: Negative.   HENT: Negative.    Eyes: Negative.   Respiratory: Negative.    Cardiovascular: Negative.   Gastrointestinal: Negative.   Genitourinary: Negative.   Musculoskeletal: Negative.   Skin: Negative.   Neurological: Negative.   Endo/Heme/Allergies: Negative.   Psychiatric/Behavioral: Negative.      Family History  Problem Relation Age of Onset   Allergic rhinitis Neg Hx    Angioedema Neg Hx    Asthma Neg Hx    Atopy Neg Hx    Eczema Neg Hx    Immunodeficiency Neg Hx    Urticaria Neg Hx     Social History   Socioeconomic History   Marital status: Married    Spouse name: Not on file   Number of children: Not on file   Years of education: Not on file   Highest education level:  Not on file  Occupational History   Not on file  Tobacco Use   Smoking status: Former   Smokeless tobacco: Former  Scientific laboratory technician Use: Never used  Substance and Sexual Activity   Alcohol use: Yes    Comment: social   Drug use: No   Sexual activity: Not Currently    Birth control/protection: Post-menopausal  Other Topics Concern   Not on file  Social History Narrative   Not on file   Environmental and Social history  Lives in a mobile home with a dry environment, no animals located inside the household, no carpet in the bedroom, no plastic on the bed, no plastic on the pillow, no smoking ongoing with inside the household.  She works in an office setting.  Objective:   Vitals:   11/25/21 0949  BP:  118/76  Pulse: 64  Resp: 18  Temp: 98.3 F (36.8 C)  SpO2: 97%   Height: 5\' 6"  (167.6 cm) Weight: 203 lb 6.4 oz (92.3 kg)  Physical Exam Constitutional:      Appearance: She is not diaphoretic.  HENT:     Head: Normocephalic.     Right Ear: Tympanic membrane, ear canal and external ear normal.     Left Ear: Tympanic membrane, ear canal and external ear normal.     Nose: Nose normal. No mucosal edema or rhinorrhea.     Mouth/Throat:     Pharynx: Uvula midline. No oropharyngeal exudate.  Eyes:     Conjunctiva/sclera: Conjunctivae normal.  Neck:     Thyroid: No thyromegaly.     Trachea: Trachea normal. No tracheal tenderness or tracheal deviation.  Cardiovascular:     Rate and Rhythm: Normal rate and regular rhythm.     Heart sounds: Normal heart sounds, S1 normal and S2 normal. No murmur heard. Pulmonary:     Effort: No respiratory distress.     Breath sounds: Normal breath sounds. No stridor. No wheezing or rales.  Lymphadenopathy:     Head:     Right side of head: No tonsillar adenopathy.     Left side of head: No tonsillar adenopathy.     Cervical: No cervical adenopathy.  Skin:    Findings: Rash (Both flanks involved with extension to upper back on right with a erythematous slightly scaly dermatitis which appears to have discrete borders on both sides.) present. No erythema.     Nails: There is no clubbing.  Neurological:     Mental Status: She is alert.     Diagnostics: Allergy skin tests were performed.  She did not demonstrate any hypersensitivity against a screening panel of aeroallergens or foods.  Assessment and Plan:    1. Other atopic dermatitis   2. Inflammatory dermatosis   3. Fungal dermatosis    1.  Allergen avoidance measures???  2.  Treat and prevent inflammation:   A. Elidel applied 2 times per day, followed by,  B.  Mometasone 0.1% ointment applied 2 times per day  3.  Treat and prevent fungal overgrowth:   A.  Diflucan 150 -1 tablet 1  time per week for 4 weeks  4.  Can continue antihistamine as needed  5.  Obtain fall flu vaccine and RSV vaccine  6.  Return to clinic in 4 weeks or earlier if problem  We will treat [name] as though the dermatitis on her back is inflammatory but also possibly a fungal dermatosis with the plan noted above for the next several weeks which  includes the combination of a calcineurin inhibitor and a topical steroid as well as Diflucan for the next 4 weeks.  If she still continues to have problems in the face of this therapy then I think we need to obtain a biopsy to discern the nature of this dermatitis.  I will regroup with her in 4 weeks or earlier if there is a problem.  Jiles Prows, MD Allergy / Immunology [facility]

## 2021-11-26 ENCOUNTER — Encounter: Payer: Self-pay | Admitting: Allergy and Immunology

## 2021-11-28 ENCOUNTER — Ambulatory Visit
Admission: RE | Admit: 2021-11-28 | Discharge: 2021-11-28 | Disposition: A | Discharge status: Home / Self Care | Payer: Medicare PPO | Source: Ambulatory Visit | Attending: Physician Assistant | Admitting: Physician Assistant

## 2021-11-28 VITALS — BP 139/76 | HR 63 | Temp 97.8°F | Resp 16

## 2021-11-28 DIAGNOSIS — J069 Acute upper respiratory infection, unspecified: Secondary | ICD-10-CM | POA: Insufficient documentation

## 2021-11-28 DIAGNOSIS — Z86718 Personal history of other venous thrombosis and embolism: Secondary | ICD-10-CM | POA: Insufficient documentation

## 2021-11-28 DIAGNOSIS — I82409 Acute embolism and thrombosis of unspecified deep veins of unspecified lower extremity: Secondary | ICD-10-CM | POA: Insufficient documentation

## 2021-11-28 DIAGNOSIS — Z1152 Encounter for screening for COVID-19: Secondary | ICD-10-CM | POA: Insufficient documentation

## 2021-11-28 DIAGNOSIS — E663 Overweight: Secondary | ICD-10-CM | POA: Insufficient documentation

## 2021-11-28 DIAGNOSIS — Z87448 Personal history of other diseases of urinary system: Secondary | ICD-10-CM | POA: Insufficient documentation

## 2021-11-28 DIAGNOSIS — Z85828 Personal history of other malignant neoplasm of skin: Secondary | ICD-10-CM | POA: Insufficient documentation

## 2021-11-28 LAB — SARS CORONAVIRUS 2 (TAT 6-24 HRS): SARS Coronavirus 2: NEGATIVE

## 2021-11-28 NOTE — ED Provider Notes (Signed)
[facility]    CSN: [id number] Arrival date & time: 11/28/21  0954      History   Chief Complaint Chief Complaint  Patient presents with   Sore Throat   Headache    HPI Sandy Bradshaw is a 66 y.o. female.   Patient here today for evaluation of sore throat and headache that started 2 days ago.  She has had some congestion with mild cough.  She does have COVID exposure.  She has not had any fever.  She denies any nausea, vomiting or diarrhea.  She has been taking Advil without significant improvement.  The history is provided by the patient.  Sore Throat Associated symptoms include headaches. Pertinent negatives include no abdominal pain and no shortness of breath.  Headache Associated symptoms: congestion, cough and sore throat   Associated symptoms: no abdominal pain, no diarrhea, no ear pain, no fever, no nausea and no vomiting     Past Medical History:  Diagnosis Date   Anxiety    Arthritis    hands   Complication of anesthesia    wakes up shaking   GERD (gastroesophageal reflux disease)    Mastodynia of left breast     Patient Active Problem List   Diagnosis Date Noted   Overweight with body mass index (BMI) 25.0-29.9 11/28/2021   History of cystocele 11/28/2021   Deep venous thrombosis ([place]) 11/28/2021   History of skin cancer 11/28/2021   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      Home Medications    Prior to Admission  medications   Medication Sig Start Date End Date Taking? Authorizing Provider  ALPRAZolam Duanne Moron) 1 MG tablet Take 1 mg by mouth at bedtime as needed for anxiety. Patient not taking: Reported on 11/25/2021    [provider]  fluconazole (DIFLUCAN) 150 MG tablet 1 tablet 1 time per week for 4 weeks 11/25/21   Kozlow, Donnamarie Poag, MD  methylcellulose oral powder Take by mouth daily. Patient not taking: Reported on 11/25/2021    [provider]  methylPREDNISolone (MEDROL) 4 MG tablet Medrol dose pack. Take as instructed Patient not taking: Reported on 11/25/2021 01/13/18   Mcarthur Rossetti, MD  mometasone (ELOCON) 0.1 % ointment Apply topically daily. 11/25/21   Kozlow, Donnamarie Poag, MD  pimecrolimus (ELIDEL) 1 % cream Apply topically 2 (two) times daily. 11/25/21   Kozlow, Donnamarie Poag, MD  Probiotic Product (PROBIOTIC ADVANCED) CAPS Take by mouth.    [provider]    Family History Family History  Problem Relation Age of Onset   Allergic rhinitis Neg Hx    Angioedema Neg Hx    Asthma Neg Hx    Atopy Neg Hx    Eczema Neg Hx    Immunodeficiency Neg Hx    Urticaria Neg Hx     Social History Social History   Tobacco Use   Smoking status: Former   Smokeless tobacco: Former  Vaping Use   Vaping Use: Never used  Substance Use Topics   Alcohol use: Yes    Comment: social   Drug use: No     Allergies   Erythromycin, Erythromycin base, Tape, Other, and Reglan [metoclopramide]   Review of Systems Review of Systems  Constitutional:  Negative for chills and fever.  HENT:  Positive for congestion and sore throat. Negative for ear pain.   Eyes:  Negative for discharge and redness.  Respiratory:  Positive for cough. Negative for shortness of breath and wheezing.   Gastrointestinal:  Negative for abdominal pain, diarrhea, nausea and vomiting.  Neurological:  Positive for headaches.     Physical Exam Triage Vital Signs ED Triage Vitals  Enc Vitals Group      BP 11/28/21 1038 139/76     Pulse Rate 11/28/21 1038 63     Resp 11/28/21 1038 16     Temp 11/28/21 1038 97.8 F (36.6 C)     Temp Source 11/28/21 1038 Oral     SpO2 11/28/21 1038 97 %     Weight --      Height --      Head Circumference --      Peak Flow --      Pain Score 11/28/21 1037 0     Pain Loc --      Pain Edu? --      Excl. in GC? --    No data found.  Updated Vital Signs BP 139/76 (BP Location: Left Arm)   Pulse 63   Temp 97.8 F (36.6 C) (Oral)   Resp 16   SpO2 97%      Physical Exam Vitals and nursing note reviewed.  Constitutional:      General: She is not in acute distress.    Appearance: Normal appearance. She is not ill-appearing.  HENT:     Head: Normocephalic and atraumatic.     Nose: Congestion present.     Mouth/Throat:     Mouth: Mucous membranes are moist.     Pharynx: No oropharyngeal exudate or posterior oropharyngeal erythema.  Eyes:     Conjunctiva/sclera: Conjunctivae normal.  Cardiovascular:     Rate and Rhythm: Normal rate and regular rhythm.     Heart sounds: Normal heart sounds. No murmur heard. Pulmonary:     Effort: Pulmonary effort is normal. No respiratory distress.     Breath sounds: Normal breath sounds. No wheezing, rhonchi or rales.  Skin:    General: Skin is warm and dry.  Neurological:     Mental Status: She is alert.  Psychiatric:        Mood and Affect: Mood normal.        Thought Content: Thought content normal.      UC Treatments / Results  Labs (all labs ordered are listed, but only abnormal results are displayed) Labs Reviewed  SARS CORONAVIRUS 2 (TAT 6-24 HRS)    EKG   Radiology No results found.  Procedures Procedures (including critical care time)  Medications Ordered in UC Medications - No data to display  Initial Impression / Assessment and Plan / UC Course  I have reviewed the triage vital signs and the nursing notes.  Pertinent labs & imaging results that were available during my  care of the patient were reviewed by me and considered in my medical decision making (see chart for details).    Suspect viral etiology of symptoms.  Will order COVID screening given known exposure.  Recommended  symptomatic treatment with follow-up with any further concerns.    We have no recent labs to determine if patient is a candidate for Paxlovid should she test positive, recommend she contact her primary care provider for antivirals if desired.  Final Clinical Impressions(s) / UC Diagnoses   Final diagnoses:  Encounter for screening for COVID-19  Acute upper respiratory infection   Discharge Instructions   None    ED Prescriptions   None    PDMP not reviewed this encounter.   Tomi Bamberger, PA-C 11/28/21 1426

## 2021-11-28 NOTE — ED Triage Notes (Signed)
Patient presents to UC for sore throat and HA x 2 days ago. Taking advil. Exposed to [place].   Denies fever.

## 2021-12-23 ENCOUNTER — Ambulatory Visit (INDEPENDENT_AMBULATORY_CARE_PROVIDER_SITE_OTHER): Payer: Medicare PPO | Admitting: Allergy and Immunology

## 2021-12-23 VITALS — BP 120/72 | HR 72 | Temp 98.0°F | Resp 18 | Ht 66.0 in | Wt 201.8 lb

## 2021-12-23 DIAGNOSIS — L2089 Other atopic dermatitis: Secondary | ICD-10-CM | POA: Diagnosis not present

## 2021-12-23 DIAGNOSIS — B369 Superficial mycosis, unspecified: Secondary | ICD-10-CM

## 2021-12-23 DIAGNOSIS — L989 Disorder of the skin and subcutaneous tissue, unspecified: Secondary | ICD-10-CM

## 2021-12-23 MED ORDER — LEVOCETIRIZINE DIHYDROCHLORIDE 5 MG PO TABS: 5.0000 mg | ORAL_TABLET | Freq: Every day | ORAL | 5 refills | Status: AC | PRN

## 2021-12-23 MED ORDER — TACROLIMUS 0.1 % EX OINT: TOPICAL_OINTMENT | Freq: Two times a day (BID) | CUTANEOUS | 3 refills | Status: DC

## 2021-12-23 MED ORDER — PIMECROLIMUS 1 % EX CREA: TOPICAL_CREAM | Freq: Two times a day (BID) | CUTANEOUS | 3 refills | Status: DC

## 2021-12-23 MED ORDER — MOMETASONE FUROATE 0.1 % EX OINT: TOPICAL_OINTMENT | Freq: Every day | CUTANEOUS | 3 refills | Status: DC

## 2021-12-23 MED ORDER — CLOTRIMAZOLE 1 % EX CREA: 1.0000 | TOPICAL_CREAM | Freq: Two times a day (BID) | CUTANEOUS | 3 refills | Status: DC

## 2021-12-23 NOTE — Progress Notes (Unsigned)
Sandy Bradshaw - [place] - [place] - [place] - [place]   Follow-up Note  Referring Provider: No ref. provider found Primary Provider: Patient, No Pcp Per Date of Office Visit: 12/23/2021  Subjective:   Sandy Bradshaw (DOB: [birth year]) is a 66 y.o. female who [facility] in re-evaluation of the following:  HPI: Sandy Bradshaw returns this clinic in evaluation dermatitis.  I last saw her in this clinic on 25 November 2021 for initial evaluation.  During her initial evaluation she appeared to have an inflammatory dermatosis that might have been complicated by the development of a secondary fungal infection.  We gave her a Diflucan and she had a significant migraine and will not use this medication anymore.  She has only been using mometasone ointments applied to her dermatitis as her insurance company has denied the use of Elidel.  She is better using mometasone twice a day.  Allergies as of 12/23/2021       Reactions   Erythromycin Shortness Of Breath   Erythromycin Base Shortness Of Breath   Tape Rash, Itching   Red,whelps Red,whelps   Other Other (See Comments)   Reglan [metoclopramide] Other (See Comments)   Patient states that it makes her depressed        Medication List    ALPRAZolam 1 MG tablet Commonly known as: XANAX Take 1 mg by mouth at bedtime as needed for anxiety.   methylcellulose oral powder Take by mouth daily.   mometasone 0.1 % ointment Commonly known as: ELOCON Apply topically daily.   Probiotic Advanced Caps Take by mouth.     Past Medical History:  Diagnosis Date   Anxiety    Arthritis    hands   Complication of anesthesia    wakes up shaking   GERD (gastroesophageal reflux disease)    Mastodynia of left breast     Past Surgical History:  Procedure Laterality Date   BREAST IMPLANT REMOVAL Left 04/23/2017   Procedure: REMOVAL LEFT  BREAST IMPLANT--Implant discarded.;  Surgeon: Luretha Murphy, MD;  Location: [facility];  Service: General;  Laterality: Left;   FOOT SURGERY Right    HERNIA REPAIR Right    PLACEMENT OF BREAST IMPLANTS Left 1998   Dr Shon Hough   TUBAL LIGATION  1985    Review of systems negative except as noted in HPI / PMHx or noted below:  Review of Systems  Constitutional: Negative.   HENT: Negative.    Eyes: Negative.   Respiratory: Negative.    Cardiovascular: Negative.   Gastrointestinal: Negative.   Genitourinary: Negative.   Musculoskeletal: Negative.   Skin: Negative.   Neurological: Negative.   Endo/Heme/Allergies: Negative.   Psychiatric/Behavioral: Negative.       Objective:   Vitals:   12/23/21 0956  BP: 120/72  Pulse: 72  Resp: 18  Temp: 98 F (36.7 C)  SpO2: 97%   Height: 5\' 6"  (167.6 cm)  Weight: 201 lb 12.8 oz (91.5 kg)   Physical Exam Skin:    Findings: Rash (Approximately 50% of men regarding erythematous slightly indurated dermatitis acting flanks and back.) present.     Diagnostics: none  Assessment and Plan:   1. Other atopic dermatitis   2. Inflammatory dermatosis   3. Fungal dermatosis    1.  Treat and prevent fungal overgrowth:   A.  OTC Lotrimin AF - apply 2 times per day  2.  Treat and prevent inflammation:   A. Elidel or  Protopic 0.1% (PA) applied 2 times per day, followed by,  B.  Mometasone 0.1% ointment applied 2 times per day  3. Can continue antihistamine as needed  4.  Obtain fall flu vaccine and RSV vaccine  5.  Return to clinic in 4 weeks or earlier if problem  6.  Dupilumab administration???  Sandy Bradshaw appears to have some improvement of her inflammatory dermatosis/atopic dermatitis in the face of using topical mometasone twice a day and at some point she is going to develop tachyphylaxis with the use of this agent.  I am going to try to get her into insurance company to approve a calcineurin inhibitor and we will still continue to treat her for possible secondary  fungal infection by using over-the-counter Lotrimin.  We will see her back in this clinic at the end of December and if she still continues to have significant dermatitis then we will consider starting her on dupilumab.  Laurette Schimke, MD Allergy / Immunology [facility]

## 2021-12-23 NOTE — Patient Instructions (Addendum)
   1.  Treat and prevent fungal overgrowth:   A.  OTC Lotrimin AF - apply 2 times per day  2.  Treat and prevent inflammation:   A. Elidel or Protopic 0.1% (PA) applied 2 times per day, followed by,  B.  Mometasone 0.1% ointment applied 2 times per day  3. Can continue antihistamine as needed  4.  Obtain fall flu vaccine and RSV vaccine  5.  Return to clinic in 4 weeks or earlier if problem  6.  Dupilumab administration???

## 2021-12-24 ENCOUNTER — Encounter: Payer: Self-pay | Admitting: Allergy and Immunology

## 2022-02-17 ENCOUNTER — Ambulatory Visit: Payer: Medicare PPO | Admitting: Allergy and Immunology

## 2022-04-16 ENCOUNTER — Encounter: Payer: Self-pay | Admitting: Radiology

## 2022-10-05 ENCOUNTER — Ambulatory Visit: Payer: Medicare PPO | Admitting: Podiatry

## 2022-10-08 ENCOUNTER — Ambulatory Visit: Payer: Medicare PPO | Admitting: Podiatry

## 2022-10-08 DIAGNOSIS — Z01818 Encounter for other preprocedural examination: Secondary | ICD-10-CM

## 2022-10-08 DIAGNOSIS — L989 Disorder of the skin and subcutaneous tissue, unspecified: Secondary | ICD-10-CM | POA: Diagnosis not present

## 2022-10-08 NOTE — Progress Notes (Signed)
Subjective:  Patient ID: Sandy Bradshaw, female    DOB: 12/24/1955,  MRN: [id number]  Chief Complaint  Patient presents with   Plantar Warts    67 y.o. female presents with the above complaint.  Patient presents with right lateral foot benign skin lesion painful to touch has progressive gotten worse worse with ambulation worse with pressure she states she has tried some freezing therapy and other alternative treatment to take the wart.  She would like to surgically excise her allergy denies seeing anyone else prior to seeing the pain scale is 5 out of 10 especially when it rubs on her while she is ambulating   Review of Systems: Negative except as noted in the HPI. Denies N/V/F/Ch.  Past Medical History:  Diagnosis Date   Anxiety    Arthritis    hands   Complication of anesthesia    wakes up shaking   GERD (gastroesophageal reflux disease)    Mastodynia of left breast     Current Outpatient Medications:    ALPRAZolam (XANAX) 1 MG tablet, Take 1 mg by mouth at bedtime as needed for anxiety. (Patient not taking: Reported on 11/25/2021), Disp: , Rfl:    clotrimazole (LOTRIMIN AF) 1 % cream, Apply 1 Application topically 2 (two) times daily., Disp: 113 g, Rfl: 3   fluconazole (DIFLUCAN) 150 MG tablet, 1 tablet 1 time per week for 4 weeks, Disp: 4 tablet, Rfl: 0   levocetirizine (XYZAL) 5 MG tablet, Take 1 tablet (5 mg total) by mouth daily as needed (Can take an extra dose during flare ups)., Disp: 60 tablet, Rfl: 5   methylcellulose oral powder, Take by mouth daily. (Patient not taking: Reported on 11/25/2021), Disp: , Rfl:    methylPREDNISolone (MEDROL) 4 MG tablet, Medrol dose pack. Take as instructed (Patient not taking: Reported on 11/25/2021), Disp: 21 tablet, Rfl: 0   mometasone (ELOCON) 0.1 % ointment, Apply topically daily., Disp: 45 g, Rfl: 3   pimecrolimus (ELIDEL) 1 % cream, Apply topically 2 (two) times daily., Disp: 100 g, Rfl: 3   Probiotic Product (PROBIOTIC  ADVANCED) CAPS, Take by mouth., Disp: , Rfl:    tacrolimus (PROTOPIC) 0.1 % ointment, Apply topically 2 (two) times daily., Disp: 100 g, Rfl: 3  Social History   Tobacco Use  Smoking Status Former  Smokeless Tobacco Former    Allergies  Allergen Reactions   Erythromycin Shortness Of Breath   Erythromycin Base Shortness Of Breath   Tape Rash and Itching    Red,whelps Red,whelps    Metoclopramide Other (See Comments)    Patient states that it makes her depressed   Other Other (See Comments)   Objective:  There were no vitals filed for this visit. There is no height or weight on file to calculate BMI. Constitutional Well developed. Well nourished.  Vascular Dorsalis pedis pulses palpable bilaterally. Posterior tibial pulses palpable bilaterally. Capillary refill normal to all digits.  No cyanosis or clubbing noted. Pedal hair growth normal.  Neurologic Normal speech. Oriented to person, place, and time. Epicritic sensation to light touch grossly present bilaterally.  Dermatologic Right lateral foot benign skin lesion consistent with plantar verruca with pinpoint bleeding upon debridement.  No central nucleated core noted.  Orthopedic: Normal joint ROM without pain or crepitus bilaterally. No visible deformities. No bony tenderness.   Radiographs: None Assessment:   1. Benign skin lesion   2. Encounter for preoperative examination for general surgical procedure    Plan:  Patient was evaluated and treated and  all questions answered.  Right lateral foot hindfoot benign skin lesion/plantar verruca -All questions and concerns were discussed with the patient extensive detail.  Given that she has tried other conservative treatment options that has failed them including shoe gear changes freezing the wart off.  She would like to discuss surgical excision of the wart.  I discussed my preoperative anterior portion of bone with the patient extensive detail she states understanding  like to proceed with surgery -Informed surgical risk consent was reviewed and read aloud to the patient.  I reviewed the films.  I have discussed my findings with the patient in great detail.  I have discussed all risks including but not limited to infection, stiffness, scarring, limp, disability, deformity, damage to blood vessels and nerves, numbness, poor healing, need for braces, arthritis, chronic pain, amputation, death.  All benefits and realistic expectations discussed in great detail.  I have made no promises as to the outcome.  I have provided realistic expectations.  I have offered the patient a 2nd opinion, which they have declined and assured me they preferred to proceed despite the risks   No follow-ups on file.

## 2022-10-16 ENCOUNTER — Ambulatory Visit
Admission: EM | Admit: 2022-10-16 | Discharge: 2022-10-16 | Disposition: A | Discharge status: Home / Self Care | Payer: Medicare PPO | Attending: Internal Medicine | Admitting: Internal Medicine

## 2022-10-16 DIAGNOSIS — T7840XA Allergy, unspecified, initial encounter: Secondary | ICD-10-CM

## 2022-10-16 DIAGNOSIS — T63461A Toxic effect of venom of wasps, accidental (unintentional), initial encounter: Secondary | ICD-10-CM

## 2022-10-16 DIAGNOSIS — R42 Dizziness and giddiness: Secondary | ICD-10-CM | POA: Diagnosis not present

## 2022-10-16 MED ORDER — DEXAMETHASONE SODIUM PHOSPHATE 10 MG/ML IJ SOLN
10.0000 mg | Freq: Once | INTRAMUSCULAR | Status: AC
  Administered 2022-10-16: 10 mg via INTRAMUSCULAR

## 2022-10-16 NOTE — Discharge Instructions (Signed)
You were given a steroid shot today in urgent care to help alleviate allergic reaction.  Please follow-up if any symptoms persist or worsen.

## 2022-10-16 NOTE — ED Triage Notes (Signed)
Stung by wasp on left hand this morning. Started feeling dizzy afterwards. Patient is currently dizzy.

## 2022-10-16 NOTE — ED Provider Notes (Signed)
[facility]    CSN: [id number] Arrival date & time: 10/16/22  1058      History   Chief Complaint Chief Complaint  Patient presents with   Insect Bite    HPI Sandy Bradshaw is a 67 y.o. female.   Patient presents after getting stung by a wasp on her left hand at approximately 9:30 AM this morning.  Reports that she was walking down a staircase with her hand on the railing and the wasp was on the railing.  States that she has been having intermittent dizziness since wasp sting.  Denies headache, feelings of throat closing, shortness of breath, itching, rash.  She does not taken any medications for symptoms.     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      Home Medications    Prior to Admission medications   Medication Sig Start Date End Date Taking? Authorizing Provider  ALPRAZolam Prudy Feeler) 1 MG tablet Take 1 mg by mouth at bedtime as needed for anxiety. Patient not taking: Reported on 11/25/2021    [provider]   clotrimazole (LOTRIMIN AF) 1 % cream Apply 1 Application topically 2 (two) times daily. 12/23/21   Kozlow, Alvira Philips, MD  fluconazole (DIFLUCAN) 150 MG tablet 1 tablet 1 time per week for 4 weeks 11/25/21   Kozlow, Alvira Philips, MD  levocetirizine (XYZAL) 5 MG tablet Take 1 tablet (5 mg total) by mouth daily as needed (Can take an extra dose during flare ups). 12/23/21   Kozlow, Alvira Philips, MD  methylcellulose oral powder Take by mouth daily. Patient not taking: Reported on 11/25/2021    [provider]  methylPREDNISolone (MEDROL) 4 MG tablet Medrol dose pack. Take as instructed Patient not taking: Reported on 11/25/2021 01/13/18   Kathryne Hitch, MD  mometasone (ELOCON) 0.1 % ointment Apply topically daily. 12/23/21   Kozlow, Alvira Philips, MD  pimecrolimus (ELIDEL) 1 % cream Apply topically 2 (two) times daily. 12/23/21   Kozlow, Alvira Philips, MD  Probiotic Product (PROBIOTIC ADVANCED) CAPS Take by mouth.    [provider]  tacrolimus (PROTOPIC) 0.1 % ointment Apply topically 2 (two) times daily. 12/23/21   Kozlow, Alvira Philips, MD    Family History Family History  Problem Relation Age of Onset   Allergic rhinitis Neg Hx    Angioedema Neg Hx    Asthma Neg Hx    Atopy Neg Hx    Eczema Neg Hx  Immunodeficiency Neg Hx    Urticaria Neg Hx     Social History Social History   Tobacco Use   Smoking status: Former   Smokeless tobacco: Former  Building services engineer status: Never Used  Substance Use Topics   Alcohol use: Yes    Comment: social   Drug use: No     Allergies   Erythromycin, Erythromycin base, Tape, Metoclopramide, and Other   Review of Systems Review of Systems Per HPI  Physical Exam Triage Vital Signs ED Triage Vitals [10/16/22 1127]  Encounter Vitals Group     BP (!) 164/78     Systolic BP Percentile      Diastolic BP Percentile      Pulse Rate 63     Resp 18     Temp 97.7 F (36.5 C)     Temp Source Oral     SpO2 98 %     Weight 200 lb (90.7 kg)      Height      Head Circumference      Peak Flow      Pain Score 0     Pain Loc      Pain Education      Exclude from Growth Chart    No data found.  Updated Vital Signs BP (!) 164/78 (BP Location: Left Arm)   Pulse 63   Temp 97.7 F (36.5 C) (Oral)   Resp 18   Wt 200 lb (90.7 kg)   SpO2 98%   BMI 32.28 kg/m   Visual Acuity Right Eye Distance:   Left Eye Distance:   Bilateral Distance:    Right Eye Near:   Left Eye Near:    Bilateral Near:     Physical Exam Constitutional:      General: She is not in acute distress.    Appearance: Normal appearance. She is not toxic-appearing or diaphoretic.  HENT:     Head: Normocephalic and atraumatic.     Mouth/Throat:     Pharynx: No pharyngeal swelling.  Eyes:     Extraocular Movements: Extraocular movements intact.     Conjunctiva/sclera: Conjunctivae normal.     Pupils: Pupils are equal, round, and reactive to light.  Pulmonary:     Effort: Pulmonary effort is normal.  Skin:    Comments: Very mild erythema present to palm of left hand at lateral left hand. No swelling noted.   Neurological:     General: No focal deficit present.     Mental Status: She is alert and oriented to person, place, and time. Mental status is at baseline.     Cranial Nerves: Cranial nerves 2-12 are intact.     Sensory: Sensation is intact.     Motor: Motor function is intact.     Coordination: Coordination is intact.     Gait: Gait is intact.  Psychiatric:        Mood and Affect: Mood normal.        Behavior: Behavior normal.        Thought Content: Thought content normal.        Judgment: Judgment normal.      UC Treatments / Results  Labs (all labs ordered are listed, but only abnormal results are displayed) Labs Reviewed - No data to display  EKG   Radiology No results found.  Procedures Procedures (including critical care time)  Medications Ordered in UC Medications  dexamethasone (DECADRON) injection 10 mg (10 mg  Intramuscular Given 10/16/22  1145)    Initial Impression / Assessment and Plan / UC Course  I have reviewed the triage vital signs and the nursing notes.  Pertinent labs & imaging results that were available during my care of the patient were reviewed by me and considered in my medical decision making (see chart for details).     Suspect dizziness is related to wasp sting allergic reaction given it started directly after getting stung by a wasp.  There are no signs of anaphylaxis on exam.  Very localized allergic reaction to the hand as well.  Will treat with IM Decadron and patient advised of antihistamine therapy.  Advised strict return precautions and ER precautions if symptoms persist or worsen.  Patient verbalized understanding and was agreeable with plan. Final Clinical Impressions(s) / UC Diagnoses   Final diagnoses:  Wasp sting, accidental or unintentional, initial encounter  Allergic reaction, initial encounter  Dizziness and giddiness     Discharge Instructions      You were given a steroid shot today in urgent care to help alleviate allergic reaction.  Please follow-up if any symptoms persist or worsen.    ED Prescriptions   None    PDMP not reviewed this encounter.   Gustavus Bryant, [place] 10/16/22 1151

## 2022-10-21 ENCOUNTER — Telehealth: Payer: Self-pay | Admitting: Podiatry

## 2022-10-21 NOTE — Telephone Encounter (Signed)
DOS- 11/16/2022  EXCISION BENIGN LESION 4.0 CM [id number]  [employer] EFFECTIVE DATE- 02/10/2019  OOP- $4000.00 WITH REMAINING $3,800.00  COINSURANCE- 0%  PER [place], NO PRIOR AUTH IS REQUIRED FOR CPT CODE 40981,  Sandy Bradshaw #: [id number] , GOOD FROM 11/16/2022 - 02/14/2023

## 2022-11-10 HISTORY — PX: FOOT SURGERY: SHX648

## 2022-11-16 ENCOUNTER — Other Ambulatory Visit: Payer: Self-pay | Admitting: Podiatry

## 2022-11-16 DIAGNOSIS — D2371 Other benign neoplasm of skin of right lower limb, including hip: Secondary | ICD-10-CM

## 2022-11-16 MED ORDER — IBUPROFEN 800 MG PO TABS: 800.0000 mg | ORAL_TABLET | Freq: Four times a day (QID) | ORAL | 1 refills | Status: DC | PRN

## 2022-11-16 MED ORDER — OXYCODONE-ACETAMINOPHEN 5-325 MG PO TABS: 1.0000 | ORAL_TABLET | ORAL | 0 refills | Status: DC | PRN

## 2022-11-24 ENCOUNTER — Encounter: Payer: Self-pay | Admitting: Podiatry

## 2022-11-24 ENCOUNTER — Ambulatory Visit (INDEPENDENT_AMBULATORY_CARE_PROVIDER_SITE_OTHER): Payer: Medicare PPO | Admitting: Podiatry

## 2022-11-24 DIAGNOSIS — L989 Disorder of the skin and subcutaneous tissue, unspecified: Secondary | ICD-10-CM

## 2022-11-24 DIAGNOSIS — Z9889 Other specified postprocedural states: Secondary | ICD-10-CM

## 2022-11-24 NOTE — Progress Notes (Signed)
Subjective:  Patient ID: Sandy Bradshaw, female    DOB: 06/30/1955,  MRN: [id number]  No chief complaint on file.   DOS: 11/16/2022 Procedure: Wide excision of benign skin lesion  67 y.o. female returns for post-op check.  Patient states that she is doing well.  Denies any other acute complaints.  Ambulating with toe  Review of Systems: Negative except as noted in the HPI. Denies N/V/F/Ch.  Past Medical History:  Diagnosis Date   Anxiety    Arthritis    hands   Complication of anesthesia    wakes up shaking   GERD (gastroesophageal reflux disease)    Mastodynia of left breast     Current Outpatient Medications:    ALPRAZolam (XANAX) 1 MG tablet, Take 1 mg by mouth at bedtime as needed for anxiety. (Patient not taking: Reported on 11/25/2021), Disp: , Rfl:    clotrimazole (LOTRIMIN AF) 1 % cream, Apply 1 Application topically 2 (two) times daily., Disp: 113 g, Rfl: 3   fluconazole (DIFLUCAN) 150 MG tablet, 1 tablet 1 time per week for 4 weeks, Disp: 4 tablet, Rfl: 0   ibuprofen (ADVIL) 800 MG tablet, Take 1 tablet (800 mg total) by mouth every 6 (six) hours as needed., Disp: 60 tablet, Rfl: 1   levocetirizine (XYZAL) 5 MG tablet, Take 1 tablet (5 mg total) by mouth daily as needed (Can take an extra dose during flare ups)., Disp: 60 tablet, Rfl: 5   methylcellulose oral powder, Take by mouth daily. (Patient not taking: Reported on 11/25/2021), Disp: , Rfl:    methylPREDNISolone (MEDROL) 4 MG tablet, Medrol dose pack. Take as instructed (Patient not taking: Reported on 11/25/2021), Disp: 21 tablet, Rfl: 0   mometasone (ELOCON) 0.1 % ointment, Apply topically daily., Disp: 45 g, Rfl: 3   oxyCODONE-acetaminophen (PERCOCET) 5-325 MG tablet, Take 1 tablet by mouth every 4 (four) hours as needed for severe pain., Disp: 30 tablet, Rfl: 0   pimecrolimus (ELIDEL) 1 % cream, Apply topically 2 (two) times daily., Disp: 100 g, Rfl: 3   Probiotic Product (PROBIOTIC ADVANCED) CAPS, Take by  mouth., Disp: , Rfl:    tacrolimus (PROTOPIC) 0.1 % ointment, Apply topically 2 (two) times daily., Disp: 100 g, Rfl: 3  Social History   Tobacco Use  Smoking Status Former  Smokeless Tobacco Former    Allergies  Allergen Reactions   Erythromycin Shortness Of Breath   Erythromycin Base Shortness Of Breath   Tape Rash and Itching    Red,whelps Red,whelps    Metoclopramide Other (See Comments)    Patient states that it makes her depressed   Other Other (See Comments)   Objective:  There were no vitals filed for this visit. There is no height or weight on file to calculate BMI. Constitutional Well developed. Well nourished.  Vascular Foot warm and well perfused. Capillary refill normal to all digits.   Neurologic Normal speech. Oriented to person, place, and time. Epicritic sensation to light touch grossly present bilaterally.  Dermatologic Skin healing well without signs of infection. Skin edges well coapted without signs of infection.  Orthopedic: Tenderness to palpation noted about the surgical site.   Radiographs: None Assessment:   1. Benign skin lesion   2. Status post foot surgery    Plan:  Patient was evaluated and treated and all questions answered.  S/p foot surgery right -Progressing as expected post-operatively. -XR: See above -WB Status: Toe weightbearing in surgical shoe -Sutures: Intact.  Supra ischial dehiscence noted no complication  noted -Medications: None -Foot redressed.  No follow-ups on file.

## 2022-12-08 ENCOUNTER — Encounter: Payer: Self-pay | Admitting: Podiatry

## 2022-12-08 ENCOUNTER — Ambulatory Visit (INDEPENDENT_AMBULATORY_CARE_PROVIDER_SITE_OTHER): Payer: Medicare PPO | Admitting: Podiatry

## 2022-12-08 VITALS — BP 164/83 | HR 78

## 2022-12-08 DIAGNOSIS — L989 Disorder of the skin and subcutaneous tissue, unspecified: Secondary | ICD-10-CM

## 2022-12-08 DIAGNOSIS — Z9889 Other specified postprocedural states: Secondary | ICD-10-CM

## 2022-12-08 NOTE — Progress Notes (Signed)
Subjective:  Patient ID: HA REINKING, female    DOB: 1956/01/26,  MRN: [id number]  Chief Complaint  Patient presents with   Routine Post Op    "I guess it's okay.  I had to change my bandage.  The one he put on worked his way out."    DOS: 11/16/2022 Procedure: Wide excision of benign skin lesion  67 y.o. female returns for post-op check.  Patient states that she is doing well.  Denies any other acute complaints.  Ambulating with toe  Review of Systems: Negative except as noted in the HPI. Denies N/V/F/Ch.  Past Medical History:  Diagnosis Date   Anxiety    Arthritis    hands   Complication of anesthesia    wakes up shaking   GERD (gastroesophageal reflux disease)    Mastodynia of left breast     Current Outpatient Medications:    clotrimazole (LOTRIMIN AF) 1 % cream, Apply 1 Application topically 2 (two) times daily., Disp: 113 g, Rfl: 3   fluconazole (DIFLUCAN) 150 MG tablet, 1 tablet 1 time per week for 4 weeks, Disp: 4 tablet, Rfl: 0   ibuprofen (ADVIL) 800 MG tablet, Take 1 tablet (800 mg total) by mouth every 6 (six) hours as needed., Disp: 60 tablet, Rfl: 1   levocetirizine (XYZAL) 5 MG tablet, Take 1 tablet (5 mg total) by mouth daily as needed (Can take an extra dose during flare ups)., Disp: 60 tablet, Rfl: 5   mometasone (ELOCON) 0.1 % ointment, Apply topically daily., Disp: 45 g, Rfl: 3   pimecrolimus (ELIDEL) 1 % cream, Apply topically 2 (two) times daily., Disp: 100 g, Rfl: 3   Probiotic Product (PROBIOTIC ADVANCED) CAPS, Take by mouth., Disp: , Rfl:    tacrolimus (PROTOPIC) 0.1 % ointment, Apply topically 2 (two) times daily., Disp: 100 g, Rfl: 3   ALPRAZolam (XANAX) 1 MG tablet, Take 1 mg by mouth at bedtime as needed for anxiety. (Patient not taking: Reported on 11/25/2021), Disp: , Rfl:    methylcellulose oral powder, Take by mouth daily. (Patient not taking: Reported on 11/25/2021), Disp: , Rfl:    methylPREDNISolone (MEDROL) 4 MG tablet, Medrol dose  pack. Take as instructed (Patient not taking: Reported on 11/25/2021), Disp: 21 tablet, Rfl: 0   oxyCODONE-acetaminophen (PERCOCET) 5-325 MG tablet, Take 1 tablet by mouth every 4 (four) hours as needed for severe pain. (Patient not taking: Reported on 12/08/2022), Disp: 30 tablet, Rfl: 0  Social History   Tobacco Use  Smoking Status Former  Smokeless Tobacco Former    Allergies  Allergen Reactions   Erythromycin Shortness Of Breath   Erythromycin Base Shortness Of Breath   Tape Rash and Itching    Red,whelps Red,whelps    Metoclopramide Other (See Comments)    Patient states that it makes her depressed   Other Other (See Comments)   Objective:   Vitals:   12/08/22 1052  BP: (!) 164/83  Pulse: 78   There is no height or weight on file to calculate BMI. Constitutional Well developed. Well nourished.  Vascular Foot warm and well perfused. Capillary refill normal to all digits.   Neurologic Normal speech. Oriented to person, place, and time. Epicritic sensation to light touch grossly present bilaterally.  Dermatologic Skin completely epithelialized.  No signs of Deis is noted no complication noted.  Orthopedic: None tenderness to palpation noted about the surgical site.   Radiographs: None Assessment:   No diagnosis found.  Plan:  Patient was evaluated and  treated and all questions answered.  S/p foot surgery right -Clinically healed and officially discharged from my care if any foot and ankle issues are future she will come back and see me.  No follow-ups on file.

## 2022-12-18 ENCOUNTER — Encounter: Payer: Self-pay | Admitting: Podiatry

## 2023-01-23 NOTE — Patient Instructions (Signed)

## 2023-01-28 ENCOUNTER — Encounter: Payer: Self-pay | Admitting: Nurse Practitioner

## 2023-01-28 ENCOUNTER — Ambulatory Visit: Payer: Medicare PPO | Admitting: Nurse Practitioner

## 2023-01-28 VITALS — BP 138/78 | HR 72 | Temp 98.4°F | Ht 67.0 in | Wt 208.8 lb

## 2023-01-28 DIAGNOSIS — E6609 Other obesity due to excess calories: Secondary | ICD-10-CM

## 2023-01-28 DIAGNOSIS — Z6832 Body mass index (BMI) 32.0-32.9, adult: Secondary | ICD-10-CM

## 2023-01-28 DIAGNOSIS — M79671 Pain in right foot: Secondary | ICD-10-CM | POA: Diagnosis not present

## 2023-01-28 DIAGNOSIS — E669 Obesity, unspecified: Secondary | ICD-10-CM | POA: Insufficient documentation

## 2023-01-28 DIAGNOSIS — E66811 Other obesity due to excess calories: Secondary | ICD-10-CM

## 2023-01-28 NOTE — Assessment & Plan Note (Signed)
BMI 32.70.  Recommended eating smaller high protein, low fat meals more frequently and exercising 30 mins a day 5 times a week with a goal of 10-15lb weight loss in the next 3 months. Patient voiced their understanding and motivation to adhere to these recommendations. ? ?

## 2023-01-28 NOTE — Progress Notes (Signed)
New Patient Office Visit  Subjective    Patient ID: Sandy Bradshaw, female    DOB: 08/28/1955  Age: 67 y.o. MRN: [id number]  CC:  Chief Complaint  Patient presents with   Foot Pain    Patient state she has been having pain in her R foot for the last few weeks. States she had surgery in October for a wart and has been having issues since then     HPI Sandy Bradshaw presents for new patient visit to establish care.  Introduced to Publishing rights manager role and practice setting.  All questions answered.  Discussed provider/patient relationship and expectations. Has not seen primary care provider in some time.  Heard about PCP and clinic at a craft fair.  FOOT PAIN Having right foot pain for the last few weeks.  Had surgery to foot on 11/16/22. Last saw podiatry on 12/08/22. A suture had popped out prior to 11/24/22, but still healed well.  At the time she walked on toes for 2 months and hopping.  Currently has pain to where incision was. Wears compression socks because if does not will swell.   Duration: months Involved foot: right Mechanism of injury:  surgery Location: to incision site Onset: gradual  Severity: 2/10  Quality:  dull, aching, and throbbing Frequency: intermittent Radiation: no Aggravating factors: weight bearing, walking, and movement  Alleviating factors: resting and foot roller Status: stable Treatments attempted: Frankincense and CBD to scar area  Relief with NSAIDs?:  No NSAIDs Taken Weakness with weight bearing or walking: sometimes when first gets up Morning stiffness: sometimes when first gets up Swelling: yes to whole foot Redness: no Bruising: no Paresthesias / decreased sensation: no  Fevers:no      01/28/2023    9:54 AM  Depression screen PHQ 2/9  Decreased Interest 0  Down, Depressed, Hopeless 0  PHQ - 2 Score 0  Altered sleeping 0  Tired, decreased energy 0  Change in appetite 0  Feeling bad or failure about yourself  0  Trouble  concentrating 0  Moving slowly or fidgety/restless 0  Suicidal thoughts 0  PHQ-9 Score 0  Difficult doing work/chores Not difficult at all       01/28/2023    9:54 AM  GAD 7 : Generalized Anxiety Score  Nervous, Anxious, on Edge 1  Control/stop worrying 1  Worry too much - different things 1  Trouble relaxing 2  Restless 1  Easily annoyed or irritable 1  Afraid - awful might happen 1  Total GAD 7 Score 8  Anxiety Difficulty Somewhat difficult   Outpatient Encounter Medications as of 01/28/2023  Medication Sig   levocetirizine (XYZAL) 5 MG tablet Take 1 tablet (5 mg total) by mouth daily as needed (Can take an extra dose during flare ups).   Probiotic Product (PROBIOTIC ADVANCED) CAPS Take 1 capsule by mouth daily at 2 PM.   [DISCONTINUED] ALPRAZolam (XANAX) 1 MG tablet Take 1 mg by mouth at bedtime as needed for anxiety. (Patient not taking: Reported on 11/25/2021)   [DISCONTINUED] clotrimazole (LOTRIMIN AF) 1 % cream Apply 1 Application topically 2 (two) times daily.   [DISCONTINUED] dicyclomine (BENTYL) 10 MG capsule Take 10 mg by mouth 4 (four) times daily -  before meals and at bedtime.   [DISCONTINUED] fluconazole (DIFLUCAN) 150 MG tablet 1 tablet 1 time per week for 4 weeks   [DISCONTINUED] ibuprofen (ADVIL) 800 MG tablet Take 1 tablet (800 mg total) by mouth every 6 (six) hours as needed.   [  DISCONTINUED] methylcellulose oral powder Take by mouth daily. (Patient not taking: Reported on 11/25/2021)   [DISCONTINUED] methylPREDNISolone (MEDROL) 4 MG tablet Medrol dose pack. Take as instructed (Patient not taking: Reported on 11/25/2021)   [DISCONTINUED] mometasone (ELOCON) 0.1 % ointment Apply topically daily.   [DISCONTINUED] oxyCODONE-acetaminophen (PERCOCET) 5-325 MG tablet Take 1 tablet by mouth every 4 (four) hours as needed for severe pain. (Patient not taking: Reported on 12/08/2022)   [DISCONTINUED] pimecrolimus (ELIDEL) 1 % cream Apply topically 2 (two) times daily.    [DISCONTINUED] tacrolimus (PROTOPIC) 0.1 % ointment Apply topically 2 (two) times daily.   No facility-administered encounter medications on file as of 01/28/2023.    Past Medical History:  Diagnosis Date   Anxiety    Arthritis    hands   Complication of anesthesia    wakes up shaking   GERD (gastroesophageal reflux disease)    Mastodynia of left breast     Past Surgical History:  Procedure Laterality Date   BREAST IMPLANT REMOVAL Left 04/23/2017   Procedure: REMOVAL LEFT  BREAST IMPLANT--Implant discarded.;  Surgeon: Luretha Murphy, MD;  Location: [facility];  Service: General;  Laterality: Left;   FOOT SURGERY Right    FOOT SURGERY Right 11/2022   HERNIA REPAIR Right    inguinal   PLACEMENT OF BREAST IMPLANTS Left 1998   Dr Shon Hough   TUBAL LIGATION  1985    Family History  Problem Relation Age of Onset   Heart disease Mother    Hypertension Mother    Diabetes Father    Heart disease Father    Hypertension Father    Endometrial cancer Daughter    Stroke Maternal Grandmother    Heart attack Paternal Grandmother    Diabetes Paternal Grandfather    Allergic rhinitis Neg Hx    Angioedema Neg Hx    Asthma Neg Hx    Atopy Neg Hx    Eczema Neg Hx    Immunodeficiency Neg Hx    Urticaria Neg Hx     Social History   Socioeconomic History   Marital status: Married    Spouse name: Not on file   Number of children: Not on file   Years of education: Not on file   Highest education level: Not on file  Occupational History   Not on file  Tobacco Use   Smoking status: Former   Smokeless tobacco: Former   Tobacco comments:    Quit around [employer] Use   Vaping status: Never Used  Substance and Sexual Activity   Alcohol use: Yes    Comment: social   Drug use: Never    Comment: CBD gummies   Sexual activity: Yes    Birth control/protection: Post-menopausal  Other Topics Concern   Not on file  Social History Narrative   Not on file    Social Drivers of Health   Financial Resource Strain: Low Risk  (01/28/2023)   Overall Financial Resource Strain (CARDIA)    Difficulty of Paying Living Expenses: Not hard at all  Food Insecurity: No Food Insecurity (01/28/2023)   Hunger Vital Sign    Worried About Running Out of Food in the Last Year: Never true    Ran Out of Food in the Last Year: Never true  Transportation Needs: No Transportation Needs (01/28/2023)   PRAPARE - Administrator, Civil Service (Medical): No    Lack of Transportation (Non-Medical): No  Physical Activity: Insufficiently Active (01/28/2023)   Exercise Vital  Sign    Days of Exercise per Week: 2 days    Minutes of Exercise per Session: 20 min  Stress: No Stress Concern Present (01/28/2023)   [employer] of Occupational Health - Occupational Stress Questionnaire    Feeling of Stress : Only a little  Social Connections: Moderately Integrated (01/28/2023)   Social Connection and Isolation Panel [NHANES]    Frequency of Communication with Friends and Family: More than three times a week    Frequency of Social Gatherings with Friends and Family: More than three times a week    Attends Religious Services: 1 to 4 times per year    Active Member of [employer] or Organizations: No    Attends Banker Meetings: Never    Marital Status: Married  Catering manager Violence: Not At Risk (01/28/2023)   Humiliation, Afraid, Rape, and Kick questionnaire    Fear of Current or Ex-Partner: No    Emotionally Abused: No    Physically Abused: No    Sexually Abused: No    Review of Systems  Constitutional:  Negative for chills, diaphoresis, fever and weight loss.  Respiratory:  Negative for cough, shortness of breath and wheezing.   Cardiovascular:  Negative for chest pain, palpitations, orthopnea and leg swelling.  Gastrointestinal: Negative.   Musculoskeletal:  Positive for joint pain.  Neurological: Negative.   Endo/Heme/Allergies:  Negative.   Psychiatric/Behavioral: Negative.         Objective    BP 138/78 (BP Location: Left Arm, Patient Position: Sitting, Cuff Size: Large)   Pulse 72   Temp 98.4 F (36.9 C) (Oral)   Ht 5\' 7"  (1.702 m)   Wt 208 lb 12.8 oz (94.7 kg)   SpO2 98%   BMI 32.70 kg/m   Physical Exam Vitals and nursing note reviewed.  Constitutional:      General: She is awake. She is not in acute distress.    Appearance: Normal appearance. She is well-developed and well-groomed. She is obese. She is not ill-appearing or toxic-appearing.  HENT:     Head: Normocephalic.     Right Ear: Hearing and external ear normal.     Left Ear: Hearing and external ear normal.  Eyes:     General: Lids are normal.        Right eye: No discharge.        Left eye: No discharge.     Conjunctiva/sclera: Conjunctivae normal.     Pupils: Pupils are equal, round, and reactive to light.  Neck:     Thyroid: No thyromegaly.     Vascular: No carotid bruit.  Cardiovascular:     Rate and Rhythm: Normal rate and regular rhythm.     Heart sounds: Normal heart sounds. No murmur heard.    No gallop.  Pulmonary:     Effort: Pulmonary effort is normal. No accessory muscle usage or respiratory distress.     Breath sounds: Normal breath sounds.  Abdominal:     General: Bowel sounds are normal. There is no distension.     Palpations: Abdomen is soft.     Tenderness: There is no abdominal tenderness.  Musculoskeletal:     Cervical back: Normal range of motion and neck supple.     Right lower leg: No edema.     Left lower leg: No edema.  Feet:     Comments: Foot exam deferred as compression hose on. Lymphadenopathy:     Cervical: No cervical adenopathy.  Skin:    General: Skin  is warm and dry.  Neurological:     Mental Status: She is alert and oriented to person, place, and time.     Deep Tendon Reflexes: Reflexes are normal and symmetric.     Reflex Scores:      Brachioradialis reflexes are 2+ on the right side  and 2+ on the left side.      Patellar reflexes are 2+ on the right side and 2+ on the left side. Psychiatric:        Attention and Perception: Attention normal.        Mood and Affect: Mood normal.        Speech: Speech normal.        Behavior: Behavior normal. Behavior is cooperative.        Thought Content: Thought content normal.    Last CBC Lab Results  Component Value Date   WBC 4.2 12/19/2015   HGB 15.3 12/19/2015   HCT 43.9 12/19/2015   MCV 88.2 12/19/2015   MCH 30.7 12/19/2015   RDW 13.4 12/19/2015   PLT 244 12/19/2015   Last metabolic panel Lab Results  Component Value Date   CREATININE 0.79 09/20/2019   GFRNONAA >60 09/20/2019      Assessment & Plan:   Problem List Items Addressed This Visit       Other   Obesity   BMI 32.70. Recommended eating smaller high protein, low fat meals more frequently and exercising 30 mins a day 5 times a week with a goal of 10-15lb weight loss in the next 3 months. Patient voiced their understanding and motivation to adhere to these recommendations.       Right foot pain - Primary   Ongoing since surgery 11/16/22, mainly to incision line.  Recommend she add Voltaren gel to regimen for pain.  She reports it is improving slowly.  If ongoing we may need to get her back into podiatry.       Return in about 4 weeks (around 02/25/2023) for Annual Physical.   Marjie Skiff, NP

## 2023-01-28 NOTE — Assessment & Plan Note (Signed)
Ongoing since surgery 11/16/22, mainly to incision line.  Recommend she add Voltaren gel to regimen for pain.  She reports it is improving slowly.  If ongoing we may need to get her back into podiatry.

## 2023-03-01 ENCOUNTER — Telehealth: Payer: Self-pay | Admitting: Nurse Practitioner

## 2023-03-01 NOTE — Telephone Encounter (Signed)
Copied from CRM [id number]. Topic: Medicare AWV >> Mar 01, 2023 11:53 AM Payton Doughty wrote: Reason for CRM: Called LVM 03/01/2023 to schedule AWV. Please schedule Virtual or Telehealth visits ONLY.   Verlee Rossetti; Care Guide Ambulatory Clinical Support [place] [facility] Direct Dial: [phone]

## 2023-03-15 ENCOUNTER — Encounter: Payer: Self-pay | Admitting: Nurse Practitioner

## 2023-03-21 NOTE — Patient Instructions (Addendum)
more than 2 hours. What foods should I eat? Fruits Aim to eat 1-2 cups of fresh, canned (in natural juice),  or frozen fruits each day. One cup of fruit equals 1 small apple, 1 large banana, 8 large strawberries, 1 cup (237 g) canned fruit,  cup (82 g) dried fruit, or 1 cup (240 mL) 100% juice. Vegetables Aim to eat 2-4 cups of fresh and frozen vegetables each day, including different varieties and colors. One cup of vegetables equals 1 cup (91 g) broccoli or cauliflower florets, 2 medium carrots, 2 cups (150 g) raw, leafy greens, 1 large tomato, 1 large bell pepper, 1 large sweet potato, or 1 medium white potato. Grains Aim to eat 5-10 ounce-equivalents of whole grains each day. Examples of 1 ounce-equivalent of grains include 1 slice of bread, 1 cup (40 g) ready-to-eat cereal, 3 cups (24 g) popcorn, or  cup (93 g) cooked rice. Meats and other proteins Try to eat 5-7 ounce-equivalents of protein each day. Examples of 1 ounce-equivalent of protein include 1 egg,  oz nuts (12 almonds, 24 pistachios, or 7 walnut halves), 1/4 cup (90 g) cooked beans, 6 tablespoons (90 g) hummus or 1 tablespoon (16 g) peanut butter. A cut of meat or fish that is the size of a deck of cards is about 3-4 ounce-equivalents (85 g). Of the protein you eat each week, try to have at least 8 sounce (227 g) of seafood. This is about 2 servings per week. This includes salmon, trout, herring, sardines, and anchovies. Dairy Aim to eat 3 cup-equivalents of fat-free or low-fat dairy each day. Examples of 1 cup-equivalent of dairy include 1 cup (240 mL) milk, 8 ounces (250 g) yogurt, 1 ounces (44 g) natural cheese, or 1 cup (240 mL) fortified soy milk. Fats and oils Aim for about 5 teaspoons (21 g) of fats and oils per day. Choose monounsaturated fats, such as canola and olive oils, mayonnaise made with olive oil or avocado oil, avocados, peanut butter, and most nuts, or polyunsaturated fats, such as sunflower, corn, and soybean oils, walnuts, pine nuts, sesame seeds, sunflower seeds, and flaxseed. Beverages Aim for 6 eight-ounce glasses of  water per day. Limit coffee to 3-5 eight-ounce cups per day. Limit caffeinated beverages that have added calories, such as soda and energy drinks. If you drink alcohol: Limit how much you have to: 0-1 drink a day if you are female. 0-2 drinks a day if you are female. Know how much alcohol is in your drink. In the [place] one drink is one 12 oz bottle of beer (355 mL), one 5 oz glass of wine (148 mL), or one 1 oz glass of hard liquor (44 mL). Seasoning and other foods Try not to add too much salt to your food. Try using herbs and spices instead of salt. Try not to add sugar to food. This information is based on [place] nutrition guidelines. To learn more, visit DisposableNylon.be. Exact amounts may vary. You may need different amounts. This information is not intended to replace advice given to you by your health care provider. Make sure you discuss any questions you have with your health care provider. Document Revised: 10/27/2021 Document Reviewed: 10/27/2021 Elsevier Patient Education  2024 [employer].

## 2023-03-25 ENCOUNTER — Ambulatory Visit (INDEPENDENT_AMBULATORY_CARE_PROVIDER_SITE_OTHER): Payer: Medicare PPO | Admitting: Nurse Practitioner

## 2023-03-25 VITALS — BP 130/73 | HR 69 | Temp 98.1°F | Ht 67.0 in | Wt 207.8 lb

## 2023-03-25 DIAGNOSIS — E66811 Obesity, class 1: Secondary | ICD-10-CM | POA: Diagnosis not present

## 2023-03-25 DIAGNOSIS — Z86718 Personal history of other venous thrombosis and embolism: Secondary | ICD-10-CM | POA: Diagnosis not present

## 2023-03-25 DIAGNOSIS — Z1231 Encounter for screening mammogram for malignant neoplasm of breast: Secondary | ICD-10-CM

## 2023-03-25 DIAGNOSIS — Z136 Encounter for screening for cardiovascular disorders: Secondary | ICD-10-CM

## 2023-03-25 DIAGNOSIS — Q798 Other congenital malformations of musculoskeletal system: Secondary | ICD-10-CM

## 2023-03-25 DIAGNOSIS — Z13220 Encounter for screening for lipoid disorders: Secondary | ICD-10-CM

## 2023-03-25 DIAGNOSIS — Z6832 Body mass index (BMI) 32.0-32.9, adult: Secondary | ICD-10-CM

## 2023-03-25 DIAGNOSIS — Z13820 Encounter for screening for osteoporosis: Secondary | ICD-10-CM

## 2023-03-25 DIAGNOSIS — E6609 Other obesity due to excess calories: Secondary | ICD-10-CM

## 2023-03-25 NOTE — Assessment & Plan Note (Signed)
Stable.  Monitor closely for recurrence.  Continue to wear compression hose.

## 2023-03-25 NOTE — Assessment & Plan Note (Signed)
Stable.  Continue to monitor and if any acute concerns may need referral to pulmonary.

## 2023-03-25 NOTE — Progress Notes (Signed)
BP 130/73 (BP Location: Left Arm, Cuff Size: Normal)   Pulse 69   Temp 98.1 F (36.7 C) (Oral)   Ht 5\' 7"  (1.702 m)   Wt 207 lb 12.8 oz (94.3 kg)   SpO2 98%   BMI 32.55 kg/m    Subjective:    Patient ID: Sandy Bradshaw, female    DOB: 15-May-1955, 68 y.o.   MRN: [id number]  HPI: Sandy Bradshaw is a 68 y.o. female presenting on 03/25/2023 for comprehensive medical examination. Current medical complaints include:none  She currently lives with: self Menopausal Symptoms: no  Functional Status Survey: Is the patient deaf or have difficulty hearing?: No Does the patient have difficulty seeing, even when wearing glasses/contacts?: No Does the patient have difficulty concentrating, remembering, or making decisions?: No Does the patient have difficulty walking or climbing stairs?: No Does the patient have difficulty dressing or bathing?: No Does the patient have difficulty doing errands alone such as visiting a doctor's office or shopping?: No   Depression Screen done today and results listed below:     03/25/2023    8:19 AM 01/28/2023    9:54 AM  Depression screen PHQ 2/9  Decreased Interest 1 0  Down, Depressed, Hopeless 1 0  PHQ - 2 Score 2 0  Altered sleeping 1 0  Tired, decreased energy 1 0  Change in appetite 1 0  Feeling bad or failure about yourself  0 0  Trouble concentrating 1 0  Moving slowly or fidgety/restless 1 0  Suicidal thoughts 0 0  PHQ-9 Score 7 0  Difficult doing work/chores Somewhat difficult Not difficult at all      03/25/2023    8:19 AM 01/28/2023    9:54 AM  GAD 7 : Generalized Anxiety Score  Nervous, Anxious, on Edge 1 1  Control/stop worrying 1 1  Worry too much - different things 1 1  Trouble relaxing 1 2  Restless 0 1  Easily annoyed or irritable 1 1  Afraid - awful might happen 1 1  Total GAD 7 Score 6 8  Anxiety Difficulty Somewhat difficult Somewhat difficult       02/05/2020   11:21 AM 11/28/2021   10:33 AM 01/28/2023     9:54 AM 03/25/2023    8:19 AM  Fall Risk  Falls in the past year?   0 0  Was there an injury with Fall?   0 0  Fall Risk Category Calculator   0 0  (RETIRED) Patient Fall Risk Level Low fall risk Low fall risk    Patient at Risk for Falls Due to   No Fall Risks No Fall Risks  Fall risk Follow up   Falls evaluation completed Falls evaluation completed    Past Medical History:  Past Medical History:  Diagnosis Date   Anxiety    Arthritis    hands   Complication of anesthesia    wakes up shaking   GERD (gastroesophageal reflux disease)    Mastodynia of left breast     Surgical History:  Past Surgical History:  Procedure Laterality Date   BREAST IMPLANT REMOVAL Left 04/23/2017   Procedure: REMOVAL LEFT  BREAST IMPLANT--Implant discarded.;  Surgeon: Luretha Murphy, MD;  Location: [facility];  Service: General;  Laterality: Left;   BREAST SURGERY     FOOT SURGERY Right    FOOT SURGERY Right 11/2022   HERNIA REPAIR Right    inguinal   PLACEMENT OF BREAST IMPLANTS Left 1998  Dr Shon Hough   TUBAL LIGATION  1985   Medications:  Current Outpatient Medications on File Prior to Visit  Medication Sig   levocetirizine (XYZAL) 5 MG tablet Take 1 tablet (5 mg total) by mouth daily as needed (Can take an extra dose during flare ups).   Probiotic Product (PROBIOTIC ADVANCED) CAPS Take 1 capsule by mouth daily at 2 PM.   No current facility-administered medications on file prior to visit.   Allergies:  Allergies  Allergen Reactions   Erythromycin Shortness Of Breath   Erythromycin Base Shortness Of Breath   Tape Rash and Itching    Red,whelps Red,whelps    Metoclopramide Other (See Comments)    Patient states that it makes her depressed   Other Other (See Comments)    Social History:  Social History   Socioeconomic History   Marital status: Married    Spouse name: Not on file   Number of children: Not on file   Years of education: Not on file   Highest  education level: Associate degree: academic program  Occupational History   Not on file  Tobacco Use   Smoking status: Former   Smokeless tobacco: Former   Tobacco comments:    Quit around [employer] Use   Vaping status: Never Used  Substance and Sexual Activity   Alcohol use: Yes    Comment: social   Drug use: Never    Comment: CBD gummies   Sexual activity: Yes    Birth control/protection: Post-menopausal  Other Topics Concern   Not on file  Social History Narrative   Not on file   Social Drivers of Health   Financial Resource Strain: Low Risk  (03/25/2023)   Overall Financial Resource Strain (CARDIA)    Difficulty of Paying Living Expenses: Not very hard  Food Insecurity: Patient Declined (03/25/2023)   Hunger Vital Sign    Worried About Running Out of Food in the Last Year: Patient declined    Ran Out of Food in the Last Year: Patient declined  Transportation Needs: No Transportation Needs (03/25/2023)   PRAPARE - Administrator, Civil Service (Medical): No    Lack of Transportation (Non-Medical): No  Physical Activity: Insufficiently Active (03/25/2023)   Exercise Vital Sign    Days of Exercise per Week: 1 day    Minutes of Exercise per Session: 20 min  Stress: Stress Concern Present (03/25/2023)   [employer] of Occupational Health - Occupational Stress Questionnaire    Feeling of Stress : Very much  Social Connections: Socially Integrated (03/25/2023)   Social Connection and Isolation Panel [NHANES]    Frequency of Communication with Friends and Family: Twice a week    Frequency of Social Gatherings with Friends and Family: Once a week    Attends Religious Services: More than 4 times per year    Active Member of [employer] or Organizations: Yes    Attends Banker Meetings: More than 4 times per year    Marital Status: Married  Catering manager Violence: Not At Risk (03/25/2023)   Humiliation, Afraid, Rape, and Kick questionnaire    Fear  of Current or Ex-Partner: No    Emotionally Abused: No    Physically Abused: No    Sexually Abused: No   Social History   Tobacco Use  Smoking Status Former  Smokeless Tobacco Former  Tobacco Comments   Quit around [employer]   Social History   Substance and Sexual Activity  Alcohol Use Yes  Comment: social    Family History:  Family History  Problem Relation Age of Onset   Heart disease Mother    Hypertension Mother    Anxiety disorder Mother    Arthritis Mother    Varicose Veins Mother    Diabetes Father    Heart disease Father    Hypertension Father    Anxiety disorder Sister    Cancer Sister    Endometrial cancer Daughter    Stroke Maternal Grandmother    Arthritis Maternal Grandmother    Heart attack Paternal Grandmother    Diabetes Paternal Grandfather    Cancer Maternal Aunt    Allergic rhinitis Neg Hx    Angioedema Neg Hx    Asthma Neg Hx    Atopy Neg Hx    Eczema Neg Hx    Immunodeficiency Neg Hx    Urticaria Neg Hx     Past medical history, surgical history, medications, allergies, family history and social history reviewed with patient today and changes made to appropriate areas of the chart.   ROS All other ROS negative except what is listed above and in the HPI.      Objective:    BP 130/73 (BP Location: Left Arm, Cuff Size: Normal)   Pulse 69   Temp 98.1 F (36.7 C) (Oral)   Ht 5\' 7"  (1.702 m)   Wt 207 lb 12.8 oz (94.3 kg)   SpO2 98%   BMI 32.55 kg/m   Wt Readings from Last 3 Encounters:  03/25/23 207 lb 12.8 oz (94.3 kg)  01/28/23 208 lb 12.8 oz (94.7 kg)  10/16/22 200 lb (90.7 kg)    Physical Exam Vitals and nursing note reviewed. Exam conducted with a chaperone present.  Constitutional:      General: She is awake. She is not in acute distress.    Appearance: She is well-developed and well-groomed. She is not ill-appearing or toxic-appearing.  HENT:     Head: Normocephalic and atraumatic.     Right Ear: Hearing, tympanic  membrane, ear canal and external ear normal. No drainage.     Left Ear: Hearing, tympanic membrane, ear canal and external ear normal. No drainage.     Nose: Nose normal.     Right Sinus: No maxillary sinus tenderness or frontal sinus tenderness.     Left Sinus: No maxillary sinus tenderness or frontal sinus tenderness.     Mouth/Throat:     Mouth: Mucous membranes are moist.     Pharynx: Oropharynx is clear. Uvula midline. No pharyngeal swelling, oropharyngeal exudate or posterior oropharyngeal erythema.  Eyes:     General: Lids are normal.        Right eye: No discharge.        Left eye: No discharge.     Extraocular Movements: Extraocular movements intact.     Conjunctiva/sclera: Conjunctivae normal.     Pupils: Pupils are equal, round, and reactive to light.     Visual Fields: Right eye visual fields normal and left eye visual fields normal.  Neck:     Thyroid: No thyromegaly.     Vascular: No carotid bruit.     Trachea: Trachea normal.  Cardiovascular:     Rate and Rhythm: Normal rate and regular rhythm.     Heart sounds: Normal heart sounds. No murmur heard.    No gallop.  Pulmonary:     Effort: Pulmonary effort is normal. No accessory muscle usage or respiratory distress.     Breath sounds: Normal breath  sounds.  Chest:  Breasts:    Right: Normal.     Left: Normal.  Abdominal:     General: Bowel sounds are normal.     Palpations: Abdomen is soft. There is no hepatomegaly or splenomegaly.     Tenderness: There is no abdominal tenderness.  Musculoskeletal:        General: Normal range of motion.     Cervical back: Normal range of motion and neck supple.     Right lower leg: No edema.     Left lower leg: No edema.  Lymphadenopathy:     Head:     Right side of head: No submental, submandibular, tonsillar, preauricular or posterior auricular adenopathy.     Left side of head: No submental, submandibular, tonsillar, preauricular or posterior auricular adenopathy.      Cervical: No cervical adenopathy.     Upper Body:     Right upper body: No supraclavicular, axillary or pectoral adenopathy.     Left upper body: No supraclavicular, axillary or pectoral adenopathy.  Skin:    General: Skin is warm and dry.     Capillary Refill: Capillary refill takes less than 2 seconds.     Findings: No rash.  Neurological:     Mental Status: She is alert and oriented to person, place, and time.     Gait: Gait is intact.     Deep Tendon Reflexes: Reflexes are normal and symmetric.     Reflex Scores:      Brachioradialis reflexes are 2+ on the right side and 2+ on the left side.      Patellar reflexes are 2+ on the right side and 2+ on the left side. Psychiatric:        Attention and Perception: Attention normal.        Mood and Affect: Mood normal.        Speech: Speech normal.        Behavior: Behavior normal. Behavior is cooperative.        Thought Content: Thought content normal.        Judgment: Judgment normal.       03/25/2023    8:13 AM  6CIT Screen  What Year? 0 points  What month? 0 points  What time? 0 points  Count back from 20 0 points  Months in reverse 0 points  Repeat phrase 2 points  Total Score 2 points   Results for orders placed or performed during the hospital encounter of 11/28/21  SARS CORONAVIRUS 2 (TAT 6-24 HRS) Anterior Nasal Swab   Collection Time: 11/28/21 10:51 AM   Specimen: Anterior Nasal Swab  Result Value Ref Range   SARS Coronavirus 2 NEGATIVE NEGATIVE      Assessment & Plan:   Problem List Items Addressed This Visit       Musculoskeletal and Integument   Poland's syndrome-right - Primary   Stable.  Continue to monitor and if any acute concerns may need referral to pulmonary.      Relevant Orders   CBC with Differential/Platelet   Comprehensive metabolic panel     Other   History of DVT in adulthood   Stable.  Monitor closely for recurrence.  Continue to wear compression hose.      Relevant Orders   CBC  with Differential/Platelet   Comprehensive metabolic panel   TSH   Obesity   BMI 32.55. Recommended eating smaller high protein, low fat meals more frequently and exercising 30 mins a day 5 times  a week with a goal of 10-15lb weight loss in the next 3 months. Patient voiced their understanding and motivation to adhere to these recommendations.       Other Visit Diagnoses       Encounter for lipid screening for cardiovascular disease       Lipid panel today.   Relevant Orders   Comprehensive metabolic panel   Lipid Panel w/o Chol/HDL Ratio     Encounter for screening mammogram for malignant neoplasm of breast       Mammogram ordered and instructed on how to schedule.   Relevant Orders   MM 3D SCREENING MAMMOGRAM BILATERAL BREAST     Screening for osteoporosis       Bone density ordered and instructed on how to schedule.   Relevant Orders   DG Bone Density       Follow up plan: Return in about 1 year (around 03/24/2024) for Annual Physical.   LABORATORY TESTING:  - Pap smear: not applicable  IMMUNIZATIONS:   - Tetanus vaccination status reviewed: last tetanus booster within 10 years. - Influenza: Refused - Pneumovax: Refused - Prevnar: Refused - COVID: Up to date - HPV: Not applicable - Shingrix vaccine: Refused  SCREENING: -Mammogram: Ordered today  - Colonoscopy:  Cologuard is up to date per patient   - Bone Density: Ordered today  -Hearing Test: Not applicable  -Spirometry: Not applicable   PATIENT COUNSELING:   Advised to take 1 mg of folate supplement per day if capable of pregnancy.   Sexuality: Discussed sexually transmitted diseases, partner selection, use of condoms, avoidance of unintended pregnancy  and contraceptive alternatives.   Advised to avoid cigarette smoking.  I discussed with the patient that most people either abstain from alcohol or drink within safe limits (<=14/week and <=4 drinks/occasion for males, <=7/weeks and <= 3 drinks/occasion for  females) and that the risk for alcohol disorders and other health effects rises proportionally with the number of drinks per week and how often a drinker exceeds daily limits.  Discussed cessation/primary prevention of drug use and availability of treatment for abuse.   Diet: Encouraged to adjust caloric intake to maintain  or achieve ideal body weight, to reduce intake of dietary saturated fat and total fat, to limit sodium intake by avoiding high sodium foods and not adding table salt, and to maintain adequate dietary potassium and calcium preferably from fresh fruits, vegetables, and low-fat dairy products.    Stressed the importance of regular exercise  Injury prevention: Discussed safety belts, safety helmets, smoke detector, smoking near bedding or upholstery.   Dental health: Discussed importance of regular tooth brushing, flossing, and dental visits.    NEXT PREVENTATIVE PHYSICAL DUE IN 1 YEAR. Return in about 1 year (around 03/24/2024) for Annual Physical.

## 2023-03-25 NOTE — Assessment & Plan Note (Signed)
BMI 32.55.  Recommended eating smaller high protein, low fat meals more frequently and exercising 30 mins a day 5 times a week with a goal of 10-15lb weight loss in the next 3 months. Patient voiced their understanding and motivation to adhere to these recommendations.

## 2023-03-26 ENCOUNTER — Encounter: Payer: Self-pay | Admitting: Nurse Practitioner

## 2023-03-26 ENCOUNTER — Other Ambulatory Visit: Payer: Self-pay | Admitting: Nurse Practitioner

## 2023-03-26 DIAGNOSIS — R7989 Other specified abnormal findings of blood chemistry: Secondary | ICD-10-CM

## 2023-03-26 LAB — CBC WITH DIFFERENTIAL/PLATELET
Basophils Absolute: 0.1 10*3/uL (ref 0.0–0.2)
Basos: 1 %
EOS (ABSOLUTE): 0.3 10*3/uL (ref 0.0–0.4)
Eos: 7 %
Hematocrit: 46.1 % (ref 34.0–46.6)
Hemoglobin: 15.7 g/dL (ref 11.1–15.9)
Immature Grans (Abs): 0 10*3/uL (ref 0.0–0.1)
Immature Granulocytes: 0 %
Lymphocytes Absolute: 1.8 10*3/uL (ref 0.7–3.1)
Lymphs: 41 %
MCH: 29.7 pg (ref 26.6–33.0)
MCHC: 34.1 g/dL (ref 31.5–35.7)
MCV: 87 fL (ref 79–97)
Monocytes Absolute: 0.3 10*3/uL (ref 0.1–0.9)
Monocytes: 6 %
Neutrophils Absolute: 2 10*3/uL (ref 1.4–7.0)
Neutrophils: 45 %
Platelets: 246 10*3/uL (ref 150–450)
RBC: 5.28 x10E6/uL (ref 3.77–5.28)
RDW: 12.3 % (ref 11.7–15.4)
WBC: 4.4 10*3/uL (ref 3.4–10.8)

## 2023-03-26 LAB — LIPID PANEL W/O CHOL/HDL RATIO
Cholesterol, Total: 255 mg/dL — ABNORMAL HIGH (ref 100–199)
HDL: 76 mg/dL (ref >39)
LDL Chol Calc (NIH): 157 mg/dL — ABNORMAL HIGH (ref 0–99)
Triglycerides: 124 mg/dL (ref 0–149)
VLDL Cholesterol Cal: 22 mg/dL (ref 5–40)

## 2023-03-26 LAB — COMPREHENSIVE METABOLIC PANEL
ALT: 81 [IU]/L — ABNORMAL HIGH (ref 0–32)
AST: 54 [IU]/L — ABNORMAL HIGH (ref 0–40)
Albumin: 4.6 g/dL (ref 3.9–4.9)
Alkaline Phosphatase: 137 [IU]/L — ABNORMAL HIGH (ref 44–121)
BUN/Creatinine Ratio: 13 (ref 12–28)
BUN: 12 mg/dL (ref 8–27)
Bilirubin Total: 0.5 mg/dL (ref 0.0–1.2)
CO2: 22 mmol/L (ref 20–29)
Calcium: 9.3 mg/dL (ref 8.7–10.3)
Chloride: 105 mmol/L (ref 96–106)
Creatinine, Ser: 0.9 mg/dL (ref 0.57–1.00)
Globulin, Total: 2.1 g/dL (ref 1.5–4.5)
Glucose: 105 mg/dL — ABNORMAL HIGH (ref 70–99)
Potassium: 4 mmol/L (ref 3.5–5.2)
Sodium: 143 mmol/L (ref 134–144)
Total Protein: 6.7 g/dL (ref 6.0–8.5)
eGFR: 69 mL/min/{1.73_m2} (ref >59)

## 2023-03-26 LAB — TSH: TSH: 2.68 u[IU]/mL (ref 0.450–4.500)

## 2023-03-26 NOTE — Progress Notes (Signed)
Contacted via MyChart - lab only visit in 6 weeks please    Good morning [place], your labs have returned: - Kidney function, creatinine and eGFR, remains normal.  Liver function is showing some elevations.  Any alcohol or Tylenol use at home?  Your alkaline phosphatase is also a little elevated, do you still have a gall bladder?  Let me know.  I would like to recheck labs in 4 weeks and if ongoing elevations we may consider ultrasound of liver. - Lipid panel does show elevations and when calculated risk score for stroke of heart event in next 10 years it is >7%.  This is level where statin is recommended.  However, I would like you to start by focusing on health diet changes, the American Heart Society has a ton of information on this and lowering cholesterol, plus regular activity.  We will then recheck at your next in office visit. - Remainder of labs are stable.  Any questions? Keep being amazing!!  Thank you for allowing me to participate in your care.  I appreciate you. Kindest regards, Chantia Amalfitano

## 2023-03-29 NOTE — Progress Notes (Signed)
Lab appt maid and mailed to patient

## 2023-04-22 ENCOUNTER — Ambulatory Visit: Admitting: Podiatry

## 2023-04-22 DIAGNOSIS — L0889 Other specified local infections of the skin and subcutaneous tissue: Secondary | ICD-10-CM | POA: Diagnosis not present

## 2023-04-22 DIAGNOSIS — L989 Disorder of the skin and subcutaneous tissue, unspecified: Secondary | ICD-10-CM

## 2023-04-22 NOTE — Progress Notes (Signed)
  Subjective:  Patient ID: Sandy Bradshaw, female    DOB: November 16, 1955,  MRN: [id number]  Chief Complaint  Patient presents with   Callouses    DOS: 11/16/2022 Procedure: Wide excision of benign skin lesion  68 y.o. female returns for post-op check.  Patient states that she is doing well.  Denies any other acute complaints.  Ambulating with toe  Review of Systems: Negative except as noted in the HPI. Denies N/V/F/Ch.  Past Medical History:  Diagnosis Date   Anxiety    Arthritis    hands   Complication of anesthesia    wakes up shaking   GERD (gastroesophageal reflux disease)    Mastodynia of left breast     Current Outpatient Medications:    ALPRAZolam (XANAX) 0.25 MG tablet, Take 0.125-0.25 mg by mouth daily as needed., Disp: , Rfl:    fluorouracil (EFUDEX) 5 % cream, 1 application Externally Twice a day for 7 days, Disp: , Rfl:    levocetirizine (XYZAL) 5 MG tablet, Take 1 tablet (5 mg total) by mouth daily as needed (Can take an extra dose during flare ups)., Disp: 60 tablet, Rfl: 5   Probiotic Product (PROBIOTIC ADVANCED) CAPS, Take 1 capsule by mouth daily at 2 PM., Disp: , Rfl:    triamcinolone cream (KENALOG) 0.1 %, Apply to affected areas Externally Twice a day for 30 days, Disp: , Rfl:   Social History   Tobacco Use  Smoking Status Former  Smokeless Tobacco Former  Tobacco Comments   Quit around 1979    Allergies  Allergen Reactions   Erythromycin Shortness Of Breath   Erythromycin Base Shortness Of Breath   Tape Rash and Itching    Red,whelps Red,whelps    Metoclopramide Other (See Comments)    Patient states that it makes her depressed   Other Other (See Comments)   Objective:   There were no vitals filed for this visit.  There is no height or weight on file to calculate BMI. Constitutional Well developed. Well nourished.  Vascular Foot warm and well perfused. Capillary refill normal to all digits.   Neurologic Normal speech. Oriented to  person, place, and time. Epicritic sensation to light touch grossly present bilaterally.  Dermatologic Skin completely epithelialized.  No signs of Deis is noted no complication noted.  Orthopedic: None tenderness to palpation noted about the surgical site.   Radiographs: None Assessment:   No diagnosis found.  Plan:  Patient was evaluated and treated and all questions answered.  S/p foot surgery right -Clinically healed and officially discharged from my care if any foot and ankle issues are future she will come back and see me.  No follow-ups on file.

## 2023-05-10 ENCOUNTER — Other Ambulatory Visit: Payer: Medicare PPO

## 2023-05-10 DIAGNOSIS — R7989 Other specified abnormal findings of blood chemistry: Secondary | ICD-10-CM

## 2023-05-11 ENCOUNTER — Encounter: Payer: Self-pay | Admitting: Nurse Practitioner

## 2023-05-11 DIAGNOSIS — R748 Abnormal levels of other serum enzymes: Secondary | ICD-10-CM

## 2023-05-11 LAB — COMPREHENSIVE METABOLIC PANEL WITH GFR
ALT: 93 IU/L — ABNORMAL HIGH (ref 0–32)
AST: 61 IU/L — ABNORMAL HIGH (ref 0–40)
Albumin: 4.6 g/dL (ref 3.9–4.9)
Alkaline Phosphatase: 152 IU/L — ABNORMAL HIGH (ref 44–121)
BUN/Creatinine Ratio: 12 (ref 12–28)
BUN: 12 mg/dL (ref 8–27)
Bilirubin Total: 0.5 mg/dL (ref 0.0–1.2)
CO2: 22 mmol/L (ref 20–29)
Calcium: 9.2 mg/dL (ref 8.7–10.3)
Chloride: 104 mmol/L (ref 96–106)
Creatinine, Ser: 0.97 mg/dL (ref 0.57–1.00)
Globulin, Total: 2.1 g/dL (ref 1.5–4.5)
Glucose: 106 mg/dL — ABNORMAL HIGH (ref 70–99)
Potassium: 4.1 mmol/L (ref 3.5–5.2)
Sodium: 142 mmol/L (ref 134–144)
Total Protein: 6.7 g/dL (ref 6.0–8.5)
eGFR: 63 mL/min/{1.73_m2} (ref >59)

## 2023-05-11 LAB — GAMMA GT: GGT: 276 IU/L — ABNORMAL HIGH (ref 0–60)

## 2023-05-11 NOTE — Progress Notes (Signed)
 Contacted via MyChart   Good evening Sandy Bradshaw, your liver function tests and GGT are elevated.  I would recommend we order and ultrasound of your liver to further assess.  Are you okay with this?

## 2023-05-18 ENCOUNTER — Ambulatory Visit
Admission: RE | Admit: 2023-05-18 | Discharge: 2023-05-18 | Disposition: A | Discharge status: Home / Self Care | Source: Ambulatory Visit | Attending: Nurse Practitioner | Admitting: Nurse Practitioner

## 2023-05-18 DIAGNOSIS — R748 Abnormal levels of other serum enzymes: Secondary | ICD-10-CM | POA: Diagnosis present

## 2023-05-19 ENCOUNTER — Encounter: Payer: Self-pay | Admitting: Nurse Practitioner

## 2023-05-19 ENCOUNTER — Other Ambulatory Visit: Payer: Self-pay | Admitting: Nurse Practitioner

## 2023-05-19 DIAGNOSIS — E782 Mixed hyperlipidemia: Secondary | ICD-10-CM

## 2023-05-19 DIAGNOSIS — R7989 Other specified abnormal findings of blood chemistry: Secondary | ICD-10-CM | POA: Insufficient documentation

## 2023-05-19 DIAGNOSIS — K760 Fatty (change of) liver, not elsewhere classified: Secondary | ICD-10-CM | POA: Insufficient documentation

## 2023-05-19 NOTE — Progress Notes (Signed)
 Contacted via MyChart -- needs lab only visit in 6 weeks please   Good morning Sandy Bradshaw your imaging has returned and overall no acute findings.  There is some fatty liver noted, this is a very common finding on imaging these days and if due to our diets.  We no longer grow our own food consistently or raise our own animals to use for meals, which means we have more processed foods which have varying things in them.  High fructose corn syrup is a big one.  I recommend heavy focus on diet and regular activity.  Reducing processed foods and looking at labels, trying to avoid items with high fructose corn syrup.  Also try to avoid alcohol or Tylenol.  I would like to repeat liver function testing in 6 weeks outpatient and if this trends up more I may send you to GI for further assessment.  Any questions? Keep being stellar!!  Thank you for allowing me to participate in your care.  I appreciate you. Kindest regards, Samaa Ueda

## 2023-05-24 NOTE — Progress Notes (Signed)
 Appt scheduled

## 2023-06-04 ENCOUNTER — Encounter: Payer: Self-pay | Admitting: Nurse Practitioner

## 2023-06-04 ENCOUNTER — Ambulatory Visit
Admission: RE | Admit: 2023-06-04 | Discharge: 2023-06-04 | Disposition: A | Discharge status: Home / Self Care | Source: Ambulatory Visit | Attending: Nurse Practitioner | Admitting: Nurse Practitioner

## 2023-06-04 DIAGNOSIS — Z1231 Encounter for screening mammogram for malignant neoplasm of breast: Secondary | ICD-10-CM | POA: Diagnosis present

## 2023-06-04 DIAGNOSIS — Z13820 Encounter for screening for osteoporosis: Secondary | ICD-10-CM

## 2023-06-04 DIAGNOSIS — Z780 Asymptomatic menopausal state: Secondary | ICD-10-CM | POA: Diagnosis not present

## 2023-06-04 NOTE — Progress Notes (Signed)
 Contacted via MyChart   Good news, your bone density testing was normal.

## 2023-06-08 ENCOUNTER — Encounter: Payer: Self-pay | Admitting: Nurse Practitioner

## 2023-06-08 NOTE — Progress Notes (Signed)
 Contacted via MyChart   Normal mammogram, may repeat in one year:)

## 2023-06-25 ENCOUNTER — Ambulatory Visit: Admission: EM | Admit: 2023-06-25 | Discharge: 2023-06-25 | Disposition: A | Discharge status: Home / Self Care

## 2023-06-25 ENCOUNTER — Encounter: Payer: Self-pay | Admitting: Emergency Medicine

## 2023-06-25 DIAGNOSIS — B029 Zoster without complications: Secondary | ICD-10-CM | POA: Diagnosis not present

## 2023-06-25 DIAGNOSIS — L309 Dermatitis, unspecified: Secondary | ICD-10-CM | POA: Diagnosis not present

## 2023-06-25 MED ORDER — METHYLPREDNISOLONE SODIUM SUCC 125 MG IJ SOLR
62.5000 mg | Freq: Once | INTRAMUSCULAR | Status: AC
  Administered 2023-06-25: 62.5 mg via INTRAMUSCULAR

## 2023-06-25 MED ORDER — TRIAMCINOLONE ACETONIDE 0.1 % EX CREA: 1.0000 | TOPICAL_CREAM | Freq: Three times a day (TID) | CUTANEOUS | 0 refills | Status: AC | PRN

## 2023-06-25 MED ORDER — VALACYCLOVIR HCL 1 G PO TABS: 1000.0000 mg | ORAL_TABLET | Freq: Three times a day (TID) | ORAL | 0 refills | Status: AC

## 2023-06-25 MED ORDER — PREDNISONE 20 MG PO TABS
20.0000 mg | ORAL_TABLET | Freq: Two times a day (BID) | ORAL | 0 refills | Status: AC
Start: 2023-06-26 — End: 2023-07-01

## 2023-06-25 NOTE — ED Provider Notes (Signed)
 [facility]    CSN: [id number] Arrival date & time: 06/25/23  1606      History   Chief Complaint Chief Complaint  Patient presents with   Rash   Insect Bite    HPI Sandy Bradshaw is a 68 y.o. female.   For treatment of a pruritic rash on her lower back and evaluation of neck pain with a cluster of blisters that have developed on her neck which are both burning and itching.  Per triage note: Pt reports stiffness in L-side of neck and L shoulder x2 days. Noticed this morning a rash or insect bite to area above L collarbone. Site is painful to touch and pt reports sensation of clothing moving over site is very painful. Area is inflamed, swollen, and puffy. One similar area to L upper back, but much smaller. Pt has no idea if something bit her or if it is a random rash. Pt also has eczema irritation to bilateral low back, but not associated with current issues. Pt has been applying benadryl cream to area with little relief. Notes she was walking outside day prior to rash, but did not see/feel any bugs or ticks.    Patient reports a distant history of shingles and rash on the neck she is concerned could possibly be that of a shingles rash.  She reports pain and stiffness up and down the left anterior and posterior neck.  She is only seen the cluster of blistery lesions at the distal portion of the right anterior neck however she is unable to see the the back of her neck.  She has not had any fever or any recent illness.  She reports she has had shingles in the past and has had a shingles vaccine in the past but it was the original shingles vaccine is not the updated Shingrix.  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      Home Medications    Prior to Admission medications   Medication Sig Start Date End Date Taking? Authorizing Provider  fexofenadine (ALLEGRA) 180 MG tablet Take 180 mg by mouth daily.   Yes [provider]  predniSONE  (DELTASONE ) 20 MG tablet Take 1 tablet (20 mg total) by mouth 2 (two) times  daily with a meal for 5 days. 06/26/23 07/01/23 Yes Buena Carmine, NP  Probiotic Product (PROBIOTIC ADVANCED) CAPS Take 1 capsule by mouth daily at 2 PM.   Yes [provider]  triamcinolone  cream (KENALOG ) 0.1 % Apply 1 Application topically 3 (three) times daily as needed (dermatitis rash). 06/25/23  Yes Buena Carmine, NP  valACYclovir  (VALTREX ) 1000 MG tablet Take 1 tablet (1,000 mg total) by mouth 3 (three) times daily for 7 days. 06/25/23 07/02/23 Yes Buena Carmine, NP  ALPRAZolam (XANAX) 0.25 MG tablet Take 0.125-0.25 mg by mouth daily as needed. 03/25/23   [provider]  fluorouracil (EFUDEX) 5 % cream 1 application Externally Twice a day for 7 days Patient not taking: Reported on 06/25/2023 07/10/20   [provider]   levocetirizine (XYZAL ) 5 MG tablet Take 1 tablet (5 mg total) by mouth daily as needed (Can take an extra dose during flare ups). Patient not taking: Reported on 06/25/2023 12/23/21   Kozlow, Rema Care, MD    Family History Family History  Problem Relation Age of Onset   Heart disease Mother    Hypertension Mother    Anxiety disorder Mother    Arthritis Mother    Varicose Veins Mother    Diabetes Father    Heart disease Father    Hypertension Father    Anxiety disorder Sister    Cancer Sister    Endometrial cancer Daughter    Cancer Maternal Aunt    Stroke Maternal Grandmother    Arthritis Maternal Grandmother    Heart attack Paternal Grandmother    Diabetes Paternal Grandfather    Allergic rhinitis Neg Hx    Angioedema Neg Hx    Asthma Neg Hx    Atopy Neg Hx    Eczema Neg Hx    Immunodeficiency Neg Hx    Urticaria Neg Hx    Breast cancer Neg Hx     Social History Social History   Tobacco Use   Smoking status: Former   Smokeless tobacco: Former   Tobacco comments:    Quit around [employer] Use   Vaping status: Never Used  Substance Use Topics   Alcohol use: Yes    Comment: social   Drug use: Never    Comment: CBD gummies     Allergies   Erythromycin, Erythromycin base, Tape, Wound dressing adhesive, Metoclopramide, and Other   Review of Systems Review of Systems  Skin:  Positive for rash.     Physical Exam Triage Vital Signs ED Triage Vitals  Encounter Vitals Group     BP 06/25/23 1722 (!) 148/81     Systolic BP Percentile --      Diastolic BP Percentile --      Pulse Rate 06/25/23 1722 72     Resp 06/25/23 1722 16     Temp 06/25/23 1722 98 F (36.7 C)     Temp Source 06/25/23 1722 Oral     SpO2 06/25/23 1722 96 %     Weight --      Height --      Head Circumference --      Peak Flow --      Pain Score 06/25/23 1723 5     Pain Loc --      Pain Education --      Exclude from Growth Chart --    No data found.  Updated Vital Signs BP  (!) 148/81 (BP Location: Left Arm)   Pulse 72  Temp 98 F (36.7 C) (Oral)   Resp 16   SpO2 96%   Visual Acuity Right Eye Distance:   Left Eye Distance:   Bilateral Distance:    Right Eye Near:   Left Eye Near:    Bilateral Near:     Physical Exam Constitutional:      Appearance: Normal appearance.  Eyes:     Extraocular Movements: Extraocular movements intact.     Pupils: Pupils are equal, round, and reactive to light.  Cardiovascular:     Rate and Rhythm: Normal rate and regular rhythm.  Pulmonary:     Effort: Pulmonary effort is normal.     Breath sounds: Normal breath sounds.  Musculoskeletal:     Cervical back: Normal range of motion and neck supple.  Skin:    General: Skin is warm.     Capillary Refill: Capillary refill takes less than 2 seconds.       Neurological:     General: No focal deficit present.     Mental Status: She is oriented to person, place, and time.      UC Treatments / Results  Labs (all labs ordered are listed, but only abnormal results are displayed) Labs Reviewed - No data to display  EKG   Radiology No results found.  Procedures Procedures (including critical care time)  Medications Ordered in UC Medications  methylPREDNISolone  sodium succinate (SOLU-MEDROL ) 125 mg/2 mL injection 62.5 mg (62.5 mg Intramuscular Given 06/25/23 2015)    Initial Impression / Assessment and Plan / UC Course  I have reviewed the triage vital signs and the nursing notes.  Pertinent labs & imaging results that were available during my care of the patient were reviewed by me and considered in my medical decision making (see chart for details).    Dermatitis, atopic, recurrent lower back treatment triamcinolone  cream topically up to 3 times daily as needed for itching and irritation.  To reduce current exacerbation will treat with prednisone  20 mg twice daily for 5 days.  Will treat with a one-time dose of Solu-Medrol  62.5 mg given here in clinic as  patient is complaining of diffuse itching and irritation and that the rash is currently spreading down her torso.  Rash appearing at the base and anterior portion of the neck is most concerning in appearance and symptoms are consistent with that of shingles.  Will treat as a shingles outbreak and prescribe valacyclovir  3 times daily x 7 days.  Treatment for dermatitis will also help with symptomatic management.  Patient advised to return if any of her symptoms worsen or do not improve with treatment. Final Clinical Impressions(s) / UC Diagnoses   Final diagnoses:  Dermatitis  Herpes zoster without complication     Discharge Instructions      Apply triamcinolone  cream to affected areas up to 3 times daily as needed to rash and insect bite areas. You received a steroid injection that should help decrease the inflammation and itching.   Apply triamcinolone  cream to rash and shingles rash as needed up to 3 times daily Take over the counter extra strength tylenol  or Aleve for pain as directed    ED Prescriptions     Medication Sig Dispense Auth. Provider   valACYclovir  (VALTREX ) 1000 MG tablet Take 1 tablet (1,000 mg total) by mouth 3 (three) times daily for 7 days. 21 tablet Buena Carmine, NP   predniSONE  (DELTASONE ) 20 MG tablet Take 1 tablet (20 mg total) by mouth 2 (two) times daily  with a meal for 5 days. 10 tablet Buena Carmine, NP   triamcinolone  cream (KENALOG ) 0.1 % Apply 1 Application topically 3 (three) times daily as needed (dermatitis rash). 454 g Buena Carmine, NP      PDMP not reviewed this encounter.   Buena Carmine, NP 06/27/23 2046

## 2023-06-25 NOTE — Discharge Instructions (Addendum)
 Apply triamcinolone  cream to affected areas up to 3 times daily as needed to rash and insect bite areas. You received a steroid injection that should help decrease the inflammation and itching.   Apply triamcinolone  cream to rash and shingles rash as needed up to 3 times daily Take over the counter extra strength tylenol  or Aleve for pain as directed

## 2023-06-25 NOTE — ED Triage Notes (Signed)
 Pt reports stiffness in L-side of neck and L shoulder x2 days. Noticed this morning a rash or insect bite to area above L collarbone. Site is painful to touch and pt reports sensation of clothing moving over site is very painful. Area is inflamed, swollen, and puffy. One similar area to L upper back, but much smaller. Pt has no idea if something bit her or if it is a random rash. Pt also has eczema irritation to bilateral low back, but not associated with current issues. Pt has been applying benadryl cream to area with little relief. Notes she was walking outside day prior to rash, but did not see/feel any bugs or ticks.

## 2023-07-06 ENCOUNTER — Other Ambulatory Visit

## 2023-08-20 ENCOUNTER — Other Ambulatory Visit

## 2023-08-20 DIAGNOSIS — R7989 Other specified abnormal findings of blood chemistry: Secondary | ICD-10-CM

## 2023-08-20 DIAGNOSIS — E782 Mixed hyperlipidemia: Secondary | ICD-10-CM

## 2023-08-21 ENCOUNTER — Ambulatory Visit: Payer: Self-pay | Admitting: Nurse Practitioner

## 2023-08-21 NOTE — Progress Notes (Signed)
 Contacted via MyChart The 10-year ASCVD risk score (Arnett DK, et al., 2019) is: 11.3%   Values used to calculate the score:     Age: 68 years     Clincally relevant sex: Female     Is Non-Hispanic African American: No     Diabetic: No     Tobacco smoker: No     Systolic Blood Pressure: 148 mmHg     Is BP treated: No     HDL Cholesterol: 64 mg/dL     Total Cholesterol: 232 mg/dL  Good morning Sandy Bradshaw, your labs have returned: - Lipid panel remains elevated, levels are at range, along with risk score, where statin therapy is recommended.  I recommend starting with a low dose of Rosuvastatin, 10 MG, daily and we can see how you tolerate.  Would you be okay trying this: - Liver function levels are trending down to normal. Great news. Continue what you are doing.  You are not immune to Hep B, your would benefit getting the 3 shot Hep B immunization series in the future. - Waiting on Alpha 1, but remainder of labs stable.  Any questions? Keep being amazing!!  Thank you for allowing me to participate in your care.  I appreciate you. Kindest regards, Tritia Endo

## 2023-08-27 LAB — ALPHA-1-ANTITRYPSIN DEFICIENCY

## 2023-08-27 LAB — COMPREHENSIVE METABOLIC PANEL WITH GFR
ALT: 39 IU/L — ABNORMAL HIGH (ref 0–32)
AST: 32 IU/L (ref 0–40)
Albumin: 4.6 g/dL (ref 3.9–4.9)
Alkaline Phosphatase: 110 IU/L (ref 44–121)
BUN/Creatinine Ratio: 17 (ref 12–28)
BUN: 14 mg/dL (ref 8–27)
Bilirubin Total: 0.5 mg/dL (ref 0.0–1.2)
CO2: 21 mmol/L (ref 20–29)
Calcium: 9.2 mg/dL (ref 8.7–10.3)
Chloride: 105 mmol/L (ref 96–106)
Creatinine, Ser: 0.84 mg/dL (ref 0.57–1.00)
Globulin, Total: 2.2 g/dL (ref 1.5–4.5)
Glucose: 103 mg/dL — ABNORMAL HIGH (ref 70–99)
Potassium: 4.2 mmol/L (ref 3.5–5.2)
Sodium: 141 mmol/L (ref 134–144)
Total Protein: 6.8 g/dL (ref 6.0–8.5)
eGFR: 75 mL/min/1.73 (ref >59)

## 2023-08-27 LAB — LIPID PANEL W/O CHOL/HDL RATIO
Cholesterol, Total: 232 mg/dL — ABNORMAL HIGH (ref 100–199)
HDL: 64 mg/dL (ref >39)
LDL Chol Calc (NIH): 150 mg/dL — ABNORMAL HIGH (ref 0–99)
Triglycerides: 101 mg/dL (ref 0–149)
VLDL Cholesterol Cal: 18 mg/dL (ref 5–40)

## 2023-08-27 LAB — HEPATITIS B SURFACE ANTIBODY, QUANTITATIVE: Hepatitis B Surf Ab Quant: <3.5 m[IU]/mL — ABNORMAL LOW

## 2023-08-27 LAB — PROTIME-INR
INR: 0.9 (ref 0.9–1.2)
Prothrombin Time: 10.2 s (ref 9.1–12.0)

## 2023-08-27 LAB — HEPATITIS B CORE ANTIBODY, TOTAL: Hep B Core Total Ab: NEGATIVE

## 2023-08-27 LAB — HEPATITIS B SURFACE ANTIGEN: Hepatitis B Surface Ag: NEGATIVE

## 2023-10-29 ENCOUNTER — Encounter: Payer: Self-pay | Admitting: Nurse Practitioner

## 2023-11-25 ENCOUNTER — Ambulatory Visit (INDEPENDENT_AMBULATORY_CARE_PROVIDER_SITE_OTHER): Admitting: Sleep Medicine

## 2023-11-25 ENCOUNTER — Encounter: Payer: Self-pay | Admitting: Sleep Medicine

## 2023-11-25 VITALS — BP 138/70 | HR 67 | Ht 67.0 in | Wt 197.0 lb

## 2023-11-25 DIAGNOSIS — C4492 Squamous cell carcinoma of skin, unspecified: Secondary | ICD-10-CM | POA: Insufficient documentation

## 2023-11-25 DIAGNOSIS — G4733 Obstructive sleep apnea (adult) (pediatric): Secondary | ICD-10-CM

## 2023-11-25 DIAGNOSIS — Z6830 Body mass index (BMI) 30.0-30.9, adult: Secondary | ICD-10-CM | POA: Diagnosis not present

## 2023-11-25 DIAGNOSIS — E669 Obesity, unspecified: Secondary | ICD-10-CM | POA: Diagnosis not present

## 2023-11-25 DIAGNOSIS — D099 Carcinoma in situ, unspecified: Secondary | ICD-10-CM | POA: Insufficient documentation

## 2023-11-25 DIAGNOSIS — N8110 Cystocele, unspecified: Secondary | ICD-10-CM | POA: Insufficient documentation

## 2023-11-25 NOTE — Patient Instructions (Signed)
 [name]

## 2023-11-25 NOTE — Progress Notes (Signed)
 Sandy Bradshaw MRN: [id number] DOB: [birth year]   CHIEF COMPLAINT:  EXCESSIVE DAYTIME SLEEPINESS   HISTORY OF PRESENT ILLNESS: Sandy Bradshaw is a 68 y.o. w/ a h/o allergic rhinitis and obesity who present for c/o loud snoring and witnessed apnea which has been present for several years. Reports nocturnal awakenings due to nocturia, however does not have difficulty falling back to sleep. Denies any significant weight changes. Admits to dry mouth and headaches. Denies RLS symptoms, dream enactment, cataplexy, hypnagogic or hypnapompic hallucinations. Reports a family history of sleep apnea. Denies drowsy driving. Drinks 1 cup of tea daily, occasional alcohol use, denies tobacco or illicit drug use.   Bedtime 10 pm Sleep onset 5 mins Rise time 5 am   EPWORTH SLEEP SCORE     11/25/2023    9:35 AM  Results of the Epworth flowsheet  Sitting and reading 2  Watching TV 3  Sitting, inactive in a public place (e.g. a theatre or a meeting) 0  As a passenger in a car for an hour without a break 0  Lying down to rest in the afternoon when circumstances permit 0  Sitting and talking to someone 0  Sitting quietly after a lunch without alcohol 0  In a car, while stopped for a few minutes in traffic 0  Total score 5    PAST MEDICAL HISTORY :   has a past medical history of Anxiety, Arthritis, Complication of anesthesia, GERD (gastroesophageal reflux disease), and Mastodynia of left breast.  has a past surgical history that includes Tubal ligation (1985); Hernia repair (Right); Foot surgery (Right); Placement of breast implants (Left, 1998); Breast implant removal (Left, 04/23/2017); Foot surgery (Right, 11/2022); Breast surgery; and Augmentation mammaplasty. Prior to Admission medications   Medication Sig Start Date End Date Taking? Authorizing Provider  ALPRAZolam (XANAX) 0.25 MG tablet Take 0.125-0.25 mg by mouth daily as needed. 03/25/23  Yes [provider]   fexofenadine (ALLEGRA) 180 MG tablet Take 180 mg by mouth daily.   Yes [provider]  levocetirizine (XYZAL ) 5 MG tablet Take 1 tablet (5 mg total) by mouth daily as needed (Can take an extra dose during flare ups). 12/23/21  Yes Kozlow, Camellia PARAS, MD  Probiotic Product (PROBIOTIC ADVANCED) CAPS Take 1 capsule by mouth daily at 2 PM.   Yes [provider]  triamcinolone  cream (KENALOG ) 0.1 % Apply 1 Application topically 3 (three) times daily as needed (dermatitis rash). 06/25/23  Yes Arloa Suzen RAMAN, NP   Allergies  Allergen Reactions   Erythromycin Shortness Of Breath   Erythromycin Base Shortness Of Breath   Tape Rash and Itching    Red,whelps Red,whelps    Wound Dressing Adhesive Dermatitis and Itching   Metoclopramide Other (See Comments)    Patient states that it makes her depressed   Other Other (See Comments)    FAMILY HISTORY:  family history includes Anxiety disorder in her mother and sister; Arthritis in her maternal grandmother and mother; Cancer in her maternal aunt and sister; Diabetes in her father and paternal grandfather; Endometrial cancer in her daughter; Heart attack in her paternal grandmother; Heart disease in her father and mother; Hypertension in her father and mother; Stroke in her maternal grandmother; Varicose Veins in her mother. SOCIAL HISTORY:  reports that she has quit smoking. She has quit using smokeless tobacco. She reports current alcohol use. She reports that she does not use drugs.   Review of Systems:  Gen:  Denies  fever, sweats, chills weight loss  HEENT: Denies blurred vision, double vision, ear pain, eye pain, hearing loss, nose bleeds, sore throat Cardiac:  No dizziness, chest pain or heaviness, chest tightness,edema, No JVD Resp:   No cough, -sputum production, -shortness of breath,-wheezing, -hemoptysis,  Gi: Denies swallowing difficulty, stomach pain, nausea or vomiting, diarrhea, constipation, bowel incontinence Gu:   Denies bladder incontinence, burning urine Ext:   Denies Joint pain, stiffness or swelling Skin: Denies  skin rash, easy bruising or bleeding or hives Endoc:  Denies polyuria, polydipsia , polyphagia or weight change Psych:   Denies depression, insomnia or hallucinations  Other:  All other systems negative  VITAL SIGNS: BP 138/70   Pulse 67   Ht 5' 7 (1.702 m)   Wt 197 lb (89.4 kg)   SpO2 97%   BMI 30.85 kg/m    Physical Examination:   General Appearance: No distress  EYES PERRLA, EOM intact.   NECK Supple, No JVD Pulmonary: normal breath sounds, No wheezing.  CardiovascularNormal S1,S2.  No m/r/g.   Abdomen: Benign, Soft, non-tender. Skin:   warm, no rashes, no ecchymosis  Extremities: normal, no cyanosis, clubbing. Neuro:without focal findings,  speech normal  PSYCHIATRIC: Mood, affect within normal limits.   ASSESSMENT AND PLAN  OSA I suspect that OSA is likely present due to clinical presentation. Discussed the consequences of untreated sleep apnea. Advised not to drive drowsy for safety of patient and others. Will complete further evaluation with a home sleep study and follow up to review results.    Obesity Counseled patient on diet and lifestyle modification.    MEDICATION ADJUSTMENTS/LABS AND TESTS ORDERED: Recommend Sleep Study   Patient  satisfied with Plan of action and management. All questions answered  Follow up to review HST results and treatment plan.   I spent a total of 30 minutes reviewing chart data, face-to-face evaluation with the patient, counseling and coordination of care as detailed above.    Rhena Glace, M.D.  Sleep Medicine [facility]

## 2023-12-07 ENCOUNTER — Ambulatory Visit (INDEPENDENT_AMBULATORY_CARE_PROVIDER_SITE_OTHER): Admitting: Nurse Practitioner

## 2023-12-07 VITALS — BP 126/68 | HR 68 | Temp 97.9°F | Resp 14 | Ht 67.01 in | Wt 199.0 lb

## 2023-12-07 DIAGNOSIS — R1032 Left lower quadrant pain: Secondary | ICD-10-CM | POA: Insufficient documentation

## 2023-12-07 LAB — URINALYSIS, ROUTINE W REFLEX MICROSCOPIC
Bilirubin, UA: NEGATIVE
Glucose, UA: NEGATIVE
Ketones, UA: NEGATIVE
Leukocytes,UA: NEGATIVE
Nitrite, UA: NEGATIVE
Protein,UA: NEGATIVE
Specific Gravity, UA: 1.03 (ref 1.005–1.030)
Urobilinogen, Ur: 1 mg/dL (ref 0.2–1.0)
pH, UA: 5 (ref 5.0–7.5)

## 2023-12-07 LAB — MICROSCOPIC EXAMINATION: Bacteria, UA: NONE SEEN

## 2023-12-07 NOTE — Assessment & Plan Note (Signed)
 Acute for a few days, improving today.  Suspect episode of diverticulitis related to popcorn recently.  Have recommended she continue focus on diet for the next week, no seeds, nut, popcorn.  Less bulky foods.  If symptoms return or worsen to alert PCP and then would consider abx therapy.  At this time is improving.  UA overall reassuring.

## 2023-12-07 NOTE — Progress Notes (Signed)
 BP 126/68 (BP Location: Left Arm, Patient Position: Sitting, Cuff Size: Normal)   Pulse 68   Temp 97.9 F (36.6 C) (Oral)   Resp 14   Ht 5' 7.01 (1.702 m)   Wt 199 lb (90.3 kg)   SpO2 99%   BMI 31.16 kg/m    Subjective:    Patient ID: Sandy Bradshaw, female    DOB: 1955-03-16, 68 y.o.   MRN: [id number]  HPI: Sandy Bradshaw is a 68 y.o. female  Chief Complaint  Patient presents with   Pain    Lower left side abdominal pain, started Friday. Has had diverticulosis in the past and feels this is similar pain.     ABDOMINAL PAIN  Started on Sunday, although not as bad today as it was yesterday. Ate popcorn Thursday while at theater.  Has similar flares in past with diverticulitis. Has not had colonoscopy, is about to mail Cologuard kit. Duration:days Onset: sudden Severity: mild Quality: dull and aching, familiar Location:  LLQ  Episode duration:  Radiation: no Frequency: intermittent Alleviating factors: diet changes, no seeds since Thursday Aggravating factors: ?popcorn Status: better Treatments attempted: fiber and diet changes Fever: no Nausea: no Vomiting: no Weight loss: no Decreased appetite: no Diarrhea: no Constipation: no Blood in stool: no Heartburn: no Jaundice: no Dysuria/urinary frequency: no History of sexually transmitted disease: no Recurrent NSAID use: no   Relevant past medical, surgical, family and social history reviewed and updated as indicated. Interim medical history since our last visit reviewed. Allergies and medications reviewed and updated.  Review of Systems  Constitutional:  Negative for activity change, appetite change, diaphoresis, fatigue and fever.  Respiratory:  Negative for cough, chest tightness, shortness of breath and wheezing.   Cardiovascular:  Negative for chest pain, palpitations and leg swelling.  Gastrointestinal:  Positive for abdominal pain. Negative for abdominal distention, constipation, diarrhea, nausea  and vomiting.  Neurological: Negative.   Psychiatric/Behavioral: Negative.      Per HPI unless specifically indicated above     Objective:    BP 126/68 (BP Location: Left Arm, Patient Position: Sitting, Cuff Size: Normal)   Pulse 68   Temp 97.9 F (36.6 C) (Oral)   Resp 14   Ht 5' 7.01 (1.702 m)   Wt 199 lb (90.3 kg)   SpO2 99%   BMI 31.16 kg/m   Wt Readings from Last 3 Encounters:  12/07/23 199 lb (90.3 kg)  11/25/23 197 lb (89.4 kg)  03/25/23 207 lb 12.8 oz (94.3 kg)    Physical Exam Vitals and nursing note reviewed.  Constitutional:      General: She is awake. She is not in acute distress.    Appearance: She is well-developed and well-groomed. She is obese. She is not ill-appearing or toxic-appearing.  HENT:     Head: Normocephalic.     Right Ear: Hearing and external ear normal.     Left Ear: Hearing and external ear normal.  Eyes:     General: Lids are normal.        Right eye: No discharge.        Left eye: No discharge.     Conjunctiva/sclera: Conjunctivae normal.     Pupils: Pupils are equal, round, and reactive to light.  Neck:     Thyroid: No thyromegaly.     Vascular: No carotid bruit.  Cardiovascular:     Rate and Rhythm: Normal rate and regular rhythm.     Heart sounds: Normal heart sounds. No murmur  heard.    No gallop.  Pulmonary:     Effort: Pulmonary effort is normal. No accessory muscle usage or respiratory distress.     Breath sounds: Normal breath sounds.  Abdominal:     General: Bowel sounds are normal. There is no distension.     Palpations: Abdomen is soft.     Tenderness: There is abdominal tenderness in the left lower quadrant. There is no right CVA tenderness, left CVA tenderness, guarding or rebound.     Comments: Mild tenderness LLQ, no guarding.  Musculoskeletal:     Cervical back: Normal range of motion and neck supple.     Right lower leg: No edema.     Left lower leg: No edema.  Lymphadenopathy:     Cervical: No cervical  adenopathy.  Skin:    General: Skin is warm and dry.  Neurological:     Mental Status: She is alert and oriented to person, place, and time.     Deep Tendon Reflexes: Reflexes are normal and symmetric.     Reflex Scores:      Brachioradialis reflexes are 2+ on the right side and 2+ on the left side.      Patellar reflexes are 2+ on the right side and 2+ on the left side. Psychiatric:        Attention and Perception: Attention normal.        Mood and Affect: Mood normal.        Speech: Speech normal.        Behavior: Behavior normal. Behavior is cooperative.        Thought Content: Thought content normal.     Results for orders placed or performed in visit on 08/20/23  Lipid Panel w/o Chol/HDL Ratio   Collection Time: 08/20/23  8:51 AM  Result Value Ref Range   Cholesterol, Total 232 (H) 100 - 199 mg/dL   Triglycerides 898 0 - 149 mg/dL   HDL 64 >60 mg/dL   VLDL Cholesterol Cal 18 5 - 40 mg/dL   LDL Chol Calc (NIH) 849 (H) 0 - 99 mg/dL  Joeyj-8-Jwupumbedpw Deficiency   Collection Time: 08/20/23  8:51 AM  Result Value Ref Range   AAT, DNA Analysis Comment    Additional Information: Comment    Electronically Signed by: Comment   Hepatitis B Core Antibody, total   Collection Time: 08/20/23  8:51 AM  Result Value Ref Range   Hep B Core Total Ab Negative Negative  Hepatitis B surface antibody,quantitative   Collection Time: 08/20/23  8:51 AM  Result Value Ref Range   Hepatitis B Surf Ab Quant <3.5 (L) Immunity>10 mIU/mL  Hepatitis B Surface AntiGEN   Collection Time: 08/20/23  8:51 AM  Result Value Ref Range   Hepatitis B Surface Ag Negative Negative  INR/PT   Collection Time: 08/20/23  8:51 AM  Result Value Ref Range   INR 0.9 0.9 - 1.2   Prothrombin Time 10.2 9.1 - 12.0 sec  Comp Met (CMET)   Collection Time: 08/20/23  8:51 AM  Result Value Ref Range   Glucose 103 (H) 70 - 99 mg/dL   BUN 14 8 - 27 mg/dL   Creatinine, Ser 9.15 0.57 - 1.00 mg/dL   eGFR 75 >40  fO/fpw/8.26   BUN/Creatinine Ratio 17 12 - 28   Sodium 141 134 - 144 mmol/L   Potassium 4.2 3.5 - 5.2 mmol/L   Chloride 105 96 - 106 mmol/L   CO2 21 20 - 29 mmol/L  Calcium 9.2 8.7 - 10.3 mg/dL   Total Protein 6.8 6.0 - 8.5 g/dL   Albumin 4.6 3.9 - 4.9 g/dL   Globulin, Total 2.2 1.5 - 4.5 g/dL   Bilirubin Total 0.5 0.0 - 1.2 mg/dL   Alkaline Phosphatase 110 44 - 121 IU/L   AST 32 0 - 40 IU/L   ALT 39 (H) 0 - 32 IU/L      Assessment & Plan:   Problem List Items Addressed This Visit       Other   Left lower quadrant abdominal pain - Primary   Acute for a few days, improving today.  Suspect episode of diverticulitis related to popcorn recently.  Have recommended she continue focus on diet for the next week, no seeds, nut, popcorn.  Less bulky foods.  If symptoms return or worsen to alert PCP and then would consider abx therapy.  At this time is improving.  UA overall reassuring.      Relevant Orders   Urinalysis, Routine w reflex microscopic     Follow up plan: Return for as scheduled in February for physical.

## 2023-12-07 NOTE — Patient Instructions (Signed)
 Diverticulitis  Diverticulitis is when small pouches in your colon get infected or swollen. This causes pain in your belly (abdomen) and watery poop (diarrhea). The small pouches are called diverticula. They may form if you have a condition called diverticulosis. What are the causes? You may get this condition if poop (stool) gets trapped in the pouches in your colon. The poop lets germs (bacteria) grow. This causes an infection. What increases the risk? You are more likely to get this condition if you have small pouches in your colon. You are also more likely to get it if: You are overweight or very overweight (obese). You do not exercise enough. You drink alcohol. You smoke. You eat a lot of red meat, like beef, pork, or lamb. You do not eat enough fiber. You are older than 69 years of age. What are the signs or symptoms? Pain in your belly. Pain is often on the left side, but it may be felt in other spots too. Fever and chills. Feeling like you may vomit. Vomiting. Having cramps. Feeling full. Changes in how often you poop. Blood in your poop. How is this treated? Most cases are treated at home. You may be told to: Take over-the-counter pain medicines. Only eat and drink clear liquids. Take antibiotics. Rest. Very bad cases may need to be treated at a hospital. Treatment may include: Not eating or drinking. Taking pain medicines. Getting antibiotics through an IV tube. Getting fluid and food through an IV tube. Having surgery. When you are feeling better, you may need to have a test to look at your colon (colonoscopy). Follow these instructions at home: Medicines Take over-the-counter and prescription medicines only as told by your doctor. These include: Fiber pills. Probiotics. Medicines to make your poop soft (stool softeners). If you were prescribed antibiotics, take them as told by your doctor. Do not stop taking them even if you start to feel better. Ask your  doctor if you should avoid driving or using machines while you are taking your medicine. Eating and drinking  Follow the diet told by your doctor. You may need to only eat and drink liquids. When you feel better, you may be able to eat more foods. You may also be told to eat a lot of fiber. Fiber helps you poop. Foods with fiber include berries, beans, lentils, and green vegetables. Try not to eat red meat. General instructions Do not smoke or use any products that contain nicotine or tobacco. If you need help quitting, ask your doctor. Exercise 3 or more times a week. Try to go for 30 minutes each time. Exercise enough to sweat and make your heart beat faster. Contact a doctor if: Your pain gets worse. You are not pooping like normal. Your symptoms do not get better. Your symptoms get worse very fast. You have a fever. You vomit more than one time. You have poop that is: Bloody. Black. Tarry. This information is not intended to replace advice given to you by your health care provider. Make sure you discuss any questions you have with your health care provider. Document Revised: 10/23/2021 Document Reviewed: 10/23/2021 Elsevier Patient Education  2024 [employer].

## 2023-12-08 ENCOUNTER — Encounter

## 2023-12-08 DIAGNOSIS — G4733 Obstructive sleep apnea (adult) (pediatric): Secondary | ICD-10-CM

## 2023-12-13 ENCOUNTER — Ambulatory Visit (INDEPENDENT_AMBULATORY_CARE_PROVIDER_SITE_OTHER)

## 2023-12-13 VITALS — BP 134/78 | HR 78 | Temp 97.9°F | Resp 15 | Ht 67.01 in | Wt 199.0 lb

## 2023-12-13 DIAGNOSIS — Z0000 Encounter for general adult medical examination without abnormal findings: Secondary | ICD-10-CM | POA: Diagnosis not present

## 2023-12-13 NOTE — Progress Notes (Addendum)
 Subjective:   Sandy Bradshaw is a 68 y.o. female who presents for a Medicare Annual Wellness Visit.  Allergies (verified) Erythromycin, Erythromycin base, Tape, Wound dressing adhesive, Metoclopramide, and Other   History: Past Medical History:  Diagnosis Date   Anxiety    Arthritis    hands   Complication of anesthesia    wakes up shaking   GERD (gastroesophageal reflux disease)    Mastodynia of left breast    Past Surgical History:  Procedure Laterality Date   AUGMENTATION MAMMAPLASTY     BREAST IMPLANT REMOVAL Left 04/23/2017   Procedure: REMOVAL LEFT  BREAST IMPLANT--Implant discarded.;  Surgeon: Gladis Cough, MD;  Location: [facility];  Service: General;  Laterality: Left;   BREAST SURGERY     FOOT SURGERY Right    FOOT SURGERY Right 11/2022   HERNIA REPAIR Right    inguinal   PLACEMENT OF BREAST IMPLANTS Left 1998   Dr Marcus   TUBAL LIGATION  1985   Family History  Problem Relation Age of Onset   Heart disease Mother    Hypertension Mother    Anxiety disorder Mother    Arthritis Mother    Varicose Veins Mother    Diabetes Father    Heart disease Father    Hypertension Father    Anxiety disorder Sister    Cancer Sister    Endometrial cancer Daughter    Cancer Maternal Aunt    Stroke Maternal Grandmother    Arthritis Maternal Grandmother    Heart attack Paternal Grandmother    Diabetes Paternal Grandfather    Allergic rhinitis Neg Hx    Angioedema Neg Hx    Asthma Neg Hx    Atopy Neg Hx    Eczema Neg Hx    Immunodeficiency Neg Hx    Urticaria Neg Hx    Breast cancer Neg Hx    Social History   Occupational History   Not on file  Tobacco Use   Smoking status: Former    Current packs/day: 0.00    Types: Cigarettes    Quit date: 1970    Years since quitting: 55.8    Passive exposure: Never   Smokeless tobacco: Never   Tobacco comments:    Quit around 1979  Vaping Use   Vaping status: Never Used  Substance and  Sexual Activity   Alcohol use: Yes    Comment: social   Drug use: Never    Comment: CBD gummies   Sexual activity: Yes    Birth control/protection: Post-menopausal   Tobacco Counseling Counseling given: Not Answered Tobacco comments: Quit around 1979  SDOH Screenings   Food Insecurity: No Food Insecurity (12/13/2023)  Housing: Unknown (12/13/2023)  Transportation Needs: No Transportation Needs (12/13/2023)  Utilities: Not At Risk (12/13/2023)  Alcohol Screen: Low Risk  (12/07/2023)  Depression (PHQ2-9): Low Risk  (12/13/2023)  Financial Resource Strain: Low Risk  (12/07/2023)  Physical Activity: Insufficiently Active (12/13/2023)  Social Connections: Socially Integrated (12/13/2023)  Stress: No Stress Concern Present (12/13/2023)  Recent Concern: Stress - Stress Concern Present (12/07/2023)  Tobacco Use: Medium Risk (12/13/2023)  Health Literacy: Adequate Health Literacy (12/13/2023)   Depression Screen    12/13/2023    9:19 AM 12/07/2023    3:27 PM 03/25/2023    8:19 AM 01/28/2023    9:54 AM  PHQ 2/9 Scores  PHQ - 2 Score 0 0 2 0  PHQ- 9 Score  0 7 0     Goals Addressed  This Visit's Progress     Lower cholestrol (pt-stated)        Weight (lb) < 175 lb (79.4 kg) (pt-stated)   199 lb (90.3 kg)      Visit info / Clinical Intake: Medicare Wellness Visit Type:: Subsequent Annual Wellness Visit Medicare Wellness Visit Mode:: In-person (required for WTM) Interpreter Needed?: No Pre-visit prep was completed: no AWV questionnaire completed by patient prior to visit?: no Living arrangements:: lives with spouse/significant other Patient's Overall Health Status Rating: very good Typical amount of pain: none Does pain affect daily life?: no Are you currently prescribed opioids?: no  Dietary Habits and Nutritional Risks How many meals a day?: 2 Eats fruit and vegetables daily?: yes Most meals are obtained by: preparing own meals Diabetic:: no  Functional  Status Activities of Daily Living (to include ambulation/medication): Independent Ambulation: Independent Medication Administration: Independent Home Management: Independent Manage your own finances?: yes Primary transportation is: driving Concerns about vision?: (!) yes (Wears glasses and uses drops daily) Concerns about hearing?: no  Fall Screening Falls in the past year?: 0 Number of falls in past year: 0 Was there an injury with Fall?: 0 Fall Risk Category Calculator: 0 Patient Fall Risk Level: Low Fall Risk  Fall Risk Patient at Risk for Falls Due to: No Fall Risks Fall risk Follow up: Falls evaluation completed  Home and Transportation Safety: All rugs have non-skid backing?: yes All stairs or steps have railings?: yes Grab bars in the bathtub or shower?: (!) no Have non-skid surface in bathtub or shower?: yes Good home lighting?: yes Regular seat belt use?: yes Hospital stays in the last year:: no  Cognitive Assessment Difficulty concentrating, remembering, or making decisions? : no Will 6CIT or Mini Cog be Completed: no 6CIT or Mini Cog Declined: patient alert, oriented, able to answer questions appropriately and recall recent events What year is it?: 0 points What month is it?: 0 points Give patient an address phrase to remember (5 components): [address] in [place] About what time is it?: 0 points Count backwards from 20 to 1: 0 points Say the months of the year in reverse: 0 points Repeat the address phrase from earlier: 2 points 6 CIT Score: 2 points  Advance Directives (For Healthcare) Does Patient Have a Medical Advance Directive?: No Would patient like information on creating a medical advance directive?: Yes (MAU/Ambulatory/Procedural Areas - Information given)  Reviewed/Updated  Reviewed/Updated: All        Objective:    Today's Vitals   12/13/23 0928  BP: 134/78  Pulse: 78  Resp: 15  Temp: 97.9 F (36.6 C)  TempSrc: Oral  SpO2: 98%   Weight: 199 lb (90.3 kg)  Height: 5' 7.01 (1.702 m)  PainSc: 0-No pain   Body mass index is 31.16 kg/m.  Current Medications (verified) Outpatient Encounter Medications as of 12/13/2023  Medication Sig   ALPRAZolam (XANAX) 0.25 MG tablet Take 0.125-0.25 mg by mouth daily as needed.   fexofenadine (ALLEGRA) 180 MG tablet Take 180 mg by mouth daily.   levocetirizine (XYZAL ) 5 MG tablet Take 1 tablet (5 mg total) by mouth daily as needed (Can take an extra dose during flare ups).   Probiotic Product (PROBIOTIC ADVANCED) CAPS Take 1 capsule by mouth daily at 2 PM.   triamcinolone  cream (KENALOG ) 0.1 % Apply 1 Application topically 3 (three) times daily as needed (dermatitis rash).   No facility-administered encounter medications on file as of 12/13/2023.   Hearing/Vision screen No results found. Immunizations  and Health Maintenance Health Maintenance  Topic Date Due   Fecal DNA (Cologuard)  Never done   COVID-19 Vaccine (5 - 2025-26 season) 12/23/2023 (Originally 10/11/2023)   Zoster Vaccines- Shingrix (1 of 2) 03/08/2024 (Originally 02/13/1974)   Pneumococcal Vaccine: 50+ Years (1 of 1 - PCV) 03/24/2024 (Originally 02/14/2004)   Influenza Vaccine  05/09/2024 (Originally 09/10/2023)   Hepatitis C Screening  12/06/2024 (Originally 02/14/1973)   Medicare Annual Wellness (AWV)  12/12/2024   Mammogram  06/03/2025   DTaP/Tdap/Td (2 - Tdap) 07/22/2027   DEXA SCAN  06/03/2033   Meningococcal B Vaccine  Aged Out        Assessment/Plan:  This is a routine wellness examination for Candida.  Patient Care Team: Cannady, Jolene T, NP as PCP - General (Nurse Practitioner) [facility], [place]  I have personally reviewed and noted the following in the patient's chart:   Medical and social history Use of alcohol, tobacco or illicit drugs  Current medications and supplements including opioid prescriptions. Functional ability and status Nutritional status Physical activity Advanced  directives List of other physicians Hospitalizations, surgeries, and ER visits in previous 12 months Vitals Screenings to include cognitive, depression, and falls Referrals and appointments  No orders of the defined types were placed in this encounter.  In addition, I have reviewed and discussed with patient certain preventive protocols, quality metrics, and best practice recommendations. A written personalized care plan for preventive services as well as general preventive health recommendations were provided to patient.   Comer HERO Desaray Marschner, CMA   12/13/2023   Return in 1 year (on 12/12/2024).  After Visit Summary: (In Person-Declined) Patient declined AVS at this time.

## 2023-12-13 NOTE — Patient Instructions (Signed)
 Ms. Stutsman,  Thank you for taking the time for your Medicare Wellness Visit. I appreciate your continued commitment to your health goals. Please review the care plan we discussed, and feel free to reach out if I can assist you further.  Please note that Annual Wellness Visits do not include a physical exam. Some assessments may be limited, especially if the visit was conducted virtually. If needed, we may recommend an in-person follow-up with your provider.  Ongoing Care Seeing your primary care provider every 3 to 6 months helps us  monitor your health and provide consistent, personalized care.   Referrals If a referral was made during today's visit and you haven't received any updates within two weeks, please contact the referred provider directly to check on the status.  Recommended Screenings:  Health Maintenance  Topic Date Due   Medicare Annual Wellness Visit  Never done   Cologuard (Stool DNA test)  Never done   COVID-19 Vaccine (5 - 2025-26 season) 12/23/2023*   Zoster (Shingles) Vaccine (1 of 2) 03/08/2024*   Pneumococcal Vaccine for age over 53 (1 of 1 - PCV) 03/24/2024*   Flu Shot  05/09/2024*   Hepatitis C Screening  12/06/2024*   Breast Cancer Screening  06/03/2025   DTaP/Tdap/Td vaccine (2 - Tdap) 07/22/2027   DEXA scan (bone density measurement)  06/03/2033   Meningitis B Vaccine  Aged Out  *Topic was postponed. The date shown is not the original due date.       12/13/2023    9:12 AM  Advanced Directives  Does Patient Have a Medical Advance Directive? No  Would patient like information on creating a medical advance directive? Yes (MAU/Ambulatory/Procedural Areas - Information given)    Vision: Annual vision screenings are recommended for early detection of glaucoma, cataracts, and diabetic retinopathy. These exams can also reveal signs of chronic conditions such as diabetes and high blood pressure.  Dental: Annual dental screenings help detect early signs of  oral cancer, gum disease, and other conditions linked to overall health, including heart disease and diabetes.

## 2023-12-14 LAB — COLOGUARD: COLOGUARD: NEGATIVE

## 2023-12-16 DIAGNOSIS — R0683 Snoring: Secondary | ICD-10-CM | POA: Diagnosis not present

## 2023-12-17 ENCOUNTER — Ambulatory Visit: Payer: Self-pay

## 2023-12-17 DIAGNOSIS — G4733 Obstructive sleep apnea (adult) (pediatric): Secondary | ICD-10-CM

## 2023-12-21 NOTE — Telephone Encounter (Signed)
-----   Message from PALLAVI D REDDY sent at 12/17/2023 11:02 AM EST ----- HST is negative for sleep apnea, study revealed limited sleep time and I suspect that this is a false negative result. Recommend in lab sleep study.    ----- Message ----- From: Vannie Donzell RAMAN Sent: 12/16/2023   4:45 PM EST To: Pallavi D Reddy, MD

## 2023-12-21 NOTE — Telephone Encounter (Signed)
 LMTCB. E2C2 please advise when patient calls back.

## 2023-12-21 NOTE — Telephone Encounter (Signed)
 In lab sleep study has been ordered. I have left a message on the patient v/m notifying her.  Nothing further needed.

## 2023-12-21 NOTE — Telephone Encounter (Signed)
 Copied from CRM #[id number]. Topic: General - Call Back - No Documentation >> Dec 21, 2023 11:46 AM Sandy Bradshaw wrote: Reason for CRM: Pt called back after missing CMA Sandy Bradshaw's call. Sandy Bradshaw stated the following HST is negative for sleep apnea, study revealed limited sleep time and I suspect that this is a false negative result. Recommend in lab sleep study.  On 12/17/23 CMA Sandy Bradshaw asked the pt have copied Sandy Bradshaw response to your sleep study results below. She does recommend that you have an in lab sleep study done. Please let us  know if you are willing to have it done and we can get the order placed.  Pt stated she understood and is willing to complete a in lab sleep study. Please call the pt to schedule at [phone] ok to leave a vm.

## 2024-03-28 ENCOUNTER — Encounter: Payer: Medicare PPO | Admitting: Nurse Practitioner
